# Patient Record
Sex: Female | Born: 1955
Health system: Southern US, Community
[De-identification: ages and names within clinical notes are randomized; demographics above are authoritative.]

## PROBLEM LIST (undated history)

## (undated) DIAGNOSIS — K649 Unspecified hemorrhoids: Secondary | ICD-10-CM

## (undated) DIAGNOSIS — E119 Type 2 diabetes mellitus without complications: Secondary | ICD-10-CM

## (undated) DIAGNOSIS — I10 Essential (primary) hypertension: Secondary | ICD-10-CM

## (undated) HISTORY — PX: APPENDECTOMY: SHX54

---

## 1997-09-02 ENCOUNTER — Ambulatory Visit (HOSPITAL_COMMUNITY): Admission: RE | Admit: 1997-09-02 | Discharge: 1997-09-03 | Payer: Self-pay | Admitting: *Deleted

## 1997-11-26 ENCOUNTER — Emergency Department (HOSPITAL_COMMUNITY): Admission: EM | Admit: 1997-11-26 | Discharge: 1997-11-26 | Payer: Self-pay | Admitting: Endocrinology

## 1998-05-05 ENCOUNTER — Other Ambulatory Visit: Admission: RE | Admit: 1998-05-05 | Discharge: 1998-05-05 | Payer: Self-pay | Admitting: Internal Medicine

## 1999-05-01 ENCOUNTER — Emergency Department (HOSPITAL_COMMUNITY): Admission: EM | Admit: 1999-05-01 | Discharge: 1999-05-01 | Payer: Self-pay | Admitting: *Deleted

## 1999-05-25 ENCOUNTER — Emergency Department (HOSPITAL_COMMUNITY): Admission: EM | Admit: 1999-05-25 | Discharge: 1999-05-25 | Payer: Self-pay | Admitting: Emergency Medicine

## 1999-05-29 ENCOUNTER — Encounter: Payer: Self-pay | Admitting: General Surgery

## 1999-05-29 ENCOUNTER — Ambulatory Visit (HOSPITAL_COMMUNITY): Admission: RE | Admit: 1999-05-29 | Discharge: 1999-05-29 | Payer: Self-pay | Admitting: General Surgery

## 1999-09-13 ENCOUNTER — Emergency Department (HOSPITAL_COMMUNITY): Admission: EM | Admit: 1999-09-13 | Discharge: 1999-09-13 | Payer: Self-pay | Admitting: Emergency Medicine

## 2000-04-06 ENCOUNTER — Emergency Department (HOSPITAL_COMMUNITY): Admission: EM | Admit: 2000-04-06 | Discharge: 2000-04-06 | Payer: Self-pay | Admitting: Internal Medicine

## 2000-05-28 ENCOUNTER — Encounter: Payer: Self-pay | Admitting: Emergency Medicine

## 2000-05-28 ENCOUNTER — Emergency Department (HOSPITAL_COMMUNITY): Admission: EM | Admit: 2000-05-28 | Discharge: 2000-05-28 | Payer: Self-pay | Admitting: Emergency Medicine

## 2001-10-11 ENCOUNTER — Emergency Department (HOSPITAL_COMMUNITY): Admission: EM | Admit: 2001-10-11 | Discharge: 2001-10-11 | Payer: Self-pay | Admitting: Emergency Medicine

## 2001-11-05 ENCOUNTER — Other Ambulatory Visit: Admission: RE | Admit: 2001-11-05 | Discharge: 2001-11-05 | Payer: Self-pay | Admitting: Family Medicine

## 2002-10-27 ENCOUNTER — Other Ambulatory Visit: Admission: RE | Admit: 2002-10-27 | Discharge: 2002-10-27 | Payer: Self-pay | Admitting: Family Medicine

## 2003-01-19 ENCOUNTER — Encounter: Admission: RE | Admit: 2003-01-19 | Discharge: 2003-01-19 | Payer: Self-pay | Admitting: Family Medicine

## 2003-01-19 ENCOUNTER — Encounter: Payer: Self-pay | Admitting: Family Medicine

## 2004-01-27 ENCOUNTER — Encounter: Admission: RE | Admit: 2004-01-27 | Discharge: 2004-01-27 | Payer: Self-pay | Admitting: Family Medicine

## 2004-02-15 ENCOUNTER — Emergency Department (HOSPITAL_COMMUNITY): Admission: EM | Admit: 2004-02-15 | Discharge: 2004-02-16 | Payer: Self-pay | Admitting: Emergency Medicine

## 2004-10-03 ENCOUNTER — Encounter: Admission: RE | Admit: 2004-10-03 | Discharge: 2004-10-03 | Payer: Self-pay | Admitting: Family Medicine

## 2004-11-14 ENCOUNTER — Other Ambulatory Visit: Admission: RE | Admit: 2004-11-14 | Discharge: 2004-11-14 | Payer: Self-pay | Admitting: Family Medicine

## 2004-12-04 ENCOUNTER — Encounter: Admission: RE | Admit: 2004-12-04 | Discharge: 2004-12-04 | Payer: Self-pay | Admitting: Family Medicine

## 2005-01-26 ENCOUNTER — Ambulatory Visit (HOSPITAL_COMMUNITY): Admission: RE | Admit: 2005-01-26 | Discharge: 2005-01-26 | Payer: Self-pay | Admitting: Family Medicine

## 2005-08-27 ENCOUNTER — Emergency Department (HOSPITAL_COMMUNITY): Admission: EM | Admit: 2005-08-27 | Discharge: 2005-08-28 | Payer: Self-pay | Admitting: Emergency Medicine

## 2005-09-20 ENCOUNTER — Encounter: Admission: RE | Admit: 2005-09-20 | Discharge: 2005-09-20 | Payer: Self-pay | Admitting: Family Medicine

## 2005-10-29 ENCOUNTER — Encounter: Admission: RE | Admit: 2005-10-29 | Discharge: 2005-10-29 | Payer: Self-pay | Admitting: Internal Medicine

## 2005-12-25 ENCOUNTER — Other Ambulatory Visit: Admission: RE | Admit: 2005-12-25 | Discharge: 2005-12-25 | Payer: Self-pay | Admitting: Family Medicine

## 2006-02-04 ENCOUNTER — Encounter: Admission: RE | Admit: 2006-02-04 | Discharge: 2006-02-04 | Payer: Self-pay | Admitting: Family Medicine

## 2006-09-07 ENCOUNTER — Observation Stay (HOSPITAL_COMMUNITY): Admission: EM | Admit: 2006-09-07 | Discharge: 2006-09-08 | Payer: Self-pay | Admitting: Emergency Medicine

## 2006-09-07 ENCOUNTER — Encounter (INDEPENDENT_AMBULATORY_CARE_PROVIDER_SITE_OTHER): Payer: Self-pay | Admitting: Specialist

## 2006-09-22 ENCOUNTER — Emergency Department (HOSPITAL_COMMUNITY): Admission: EM | Admit: 2006-09-22 | Discharge: 2006-09-23 | Payer: Self-pay | Admitting: Emergency Medicine

## 2007-01-23 ENCOUNTER — Other Ambulatory Visit: Admission: RE | Admit: 2007-01-23 | Discharge: 2007-01-23 | Payer: Self-pay | Admitting: Family Medicine

## 2007-02-19 ENCOUNTER — Encounter: Admission: RE | Admit: 2007-02-19 | Discharge: 2007-02-19 | Payer: Self-pay | Admitting: Family Medicine

## 2007-05-27 ENCOUNTER — Encounter: Admission: RE | Admit: 2007-05-27 | Discharge: 2007-05-27 | Payer: Self-pay | Admitting: Family Medicine

## 2007-08-22 ENCOUNTER — Emergency Department (HOSPITAL_COMMUNITY): Admission: EM | Admit: 2007-08-22 | Discharge: 2007-08-23 | Payer: Self-pay | Admitting: Emergency Medicine

## 2008-02-12 ENCOUNTER — Other Ambulatory Visit: Admission: RE | Admit: 2008-02-12 | Discharge: 2008-02-12 | Payer: Self-pay | Admitting: Family Medicine

## 2008-02-23 ENCOUNTER — Encounter: Admission: RE | Admit: 2008-02-23 | Discharge: 2008-02-23 | Payer: Self-pay | Admitting: Family Medicine

## 2008-06-03 ENCOUNTER — Emergency Department (HOSPITAL_COMMUNITY): Admission: EM | Admit: 2008-06-03 | Discharge: 2008-06-03 | Payer: Self-pay | Admitting: Emergency Medicine

## 2009-02-15 ENCOUNTER — Other Ambulatory Visit: Admission: RE | Admit: 2009-02-15 | Discharge: 2009-02-15 | Payer: Self-pay | Admitting: Family Medicine

## 2009-02-23 ENCOUNTER — Encounter: Admission: RE | Admit: 2009-02-23 | Discharge: 2009-02-23 | Payer: Self-pay | Admitting: Family Medicine

## 2010-05-04 ENCOUNTER — Emergency Department (HOSPITAL_COMMUNITY)
Admission: EM | Admit: 2010-05-04 | Discharge: 2010-05-05 | Payer: Self-pay | Source: Home / Self Care | Admitting: Emergency Medicine

## 2010-05-04 LAB — COMPREHENSIVE METABOLIC PANEL
ALT: 28 U/L (ref 0–35)
AST: 23 U/L (ref 0–37)
Albumin: 3.5 g/dL (ref 3.5–5.2)
Alkaline Phosphatase: 88 U/L (ref 39–117)
BUN: 20 mg/dL (ref 6–23)
CO2: 25 mEq/L (ref 19–32)
Calcium: 8.6 mg/dL (ref 8.4–10.5)
Chloride: 99 mEq/L (ref 96–112)
Creatinine, Ser: 1.16 mg/dL (ref 0.4–1.2)
GFR calc Af Amer: 59 mL/min — ABNORMAL LOW (ref >60)
GFR calc non Af Amer: 49 mL/min — ABNORMAL LOW (ref >60)
Glucose, Bld: 127 mg/dL — ABNORMAL HIGH (ref 70–99)
Potassium: 3.5 mEq/L (ref 3.5–5.1)
Sodium: 134 mEq/L — ABNORMAL LOW (ref 135–145)
Total Bilirubin: 0.7 mg/dL (ref 0.3–1.2)
Total Protein: 7.5 g/dL (ref 6.0–8.3)

## 2010-05-04 LAB — CBC
HCT: 39.3 % (ref 36.0–46.0)
Hemoglobin: 13.1 g/dL (ref 12.0–15.0)
MCH: 28.7 pg (ref 26.0–34.0)
MCHC: 33.3 g/dL (ref 30.0–36.0)
MCV: 86 fL (ref 78.0–100.0)
Platelets: 178 10*3/uL (ref 150–400)
RBC: 4.57 MIL/uL (ref 3.87–5.11)
RDW: 12.7 % (ref 11.5–15.5)
WBC: 6.3 10*3/uL (ref 4.0–10.5)

## 2010-05-04 LAB — DIFFERENTIAL
Basophils Absolute: 0 10*3/uL (ref 0.0–0.1)
Basophils Relative: 0 % (ref 0–1)
Eosinophils Absolute: 0.1 10*3/uL (ref 0.0–0.7)
Eosinophils Relative: 1 % (ref 0–5)
Lymphocytes Relative: 20 % (ref 12–46)
Lymphs Abs: 1.3 10*3/uL (ref 0.7–4.0)
Monocytes Absolute: 0.9 10*3/uL (ref 0.1–1.0)
Monocytes Relative: 14 % — ABNORMAL HIGH (ref 3–12)
Neutro Abs: 4.1 10*3/uL (ref 1.7–7.7)
Neutrophils Relative %: 65 % (ref 43–77)

## 2010-05-05 LAB — URINALYSIS, ROUTINE W REFLEX MICROSCOPIC
Bilirubin Urine: NEGATIVE
Hemoglobin, Urine: NEGATIVE
Ketones, ur: NEGATIVE mg/dL
Nitrite: NEGATIVE
Protein, ur: NEGATIVE mg/dL
Specific Gravity, Urine: 1.02 (ref 1.005–1.030)
Urine Glucose, Fasting: NEGATIVE mg/dL
Urobilinogen, UA: 0.2 mg/dL (ref 0.0–1.0)
pH: 5.5 (ref 5.0–8.0)

## 2010-05-20 ENCOUNTER — Encounter: Payer: Self-pay | Admitting: Family Medicine

## 2010-05-24 ENCOUNTER — Encounter
Admission: RE | Admit: 2010-05-24 | Discharge: 2010-05-30 | Payer: Self-pay | Source: Home / Self Care | Attending: Family Medicine | Admitting: Family Medicine

## 2010-05-24 ENCOUNTER — Encounter
Admission: RE | Admit: 2010-05-24 | Discharge: 2010-05-24 | Payer: Self-pay | Source: Home / Self Care | Attending: Family Medicine | Admitting: Family Medicine

## 2010-09-12 NOTE — H&P (Signed)
NAMEMarland Kitchen  Colleen [name], Colleen [name] NO.:  [id number]   MEDICAL RECORD NO.:  [id number]          PATIENT TYPE:  EMS   LOCATION:  ED                           FACILITY:  [facility]   PHYSICIAN:  Velora Heckler, MD      DATE OF BIRTH:  10/06/1955   DATE OF ADMISSION:  09/06/2006  DATE OF DISCHARGE:                              HISTORY & PHYSICAL   Referring physician:  Lorre Nick, M.D., Emergency Department   Primary care physician:  Dr. Angelita Ingles. [place]   CHIEF COMPLAINT:  Abdominal pain.   HISTORY OF PRESENT ILLNESS:  The patient is a 55 year old black female  who presents to the emergency department with a 24-hour history of  abdominal pain.  This has gradually localized to the right lower  quadrant.  The patient denies fevers, chills, nausea, vomiting or  diarrhea.  The patient was seen by emergency room physician.  Laboratory  studies were essentially normal.  CT scan abdomen and pelvis was  obtained, which showed findings of acute appendicitis with an  appendicolith and inflammatory changes.  General surgery is now called  for appendectomy.   PAST MEDICAL HISTORY:  1. Status post bilateral tubal ligation.  2. History of hypertension.  3. History of gastroesophageal reflux.  4. History of anxiety.   MEDICATIONS:  Diovan, Singulair, fluoxetine, Prilosec.   ALLERGIES:  None known.   SOCIAL HISTORY:  The patient has a 60 year old daughter.  She smokes  cigarettes.  She denies alcohol use.  She is unemployed.   FAMILY HISTORY:  Unremarkable.   A 15-system review documented in the medical record without significant  other findings except as noted above.   PHYSICAL EXAMINATION:  GENERAL:  A 55 year old mildly obese black female  on a stretcher in the emergency department in no acute distress.  VITAL SIGNS:  Temperature 97.6, pulse 67, respirations 21, blood  pressure 172/94.  HEENT:  Normocephalic, atraumatic.  Sclerae clear.  Conjunctivae clear.  Pupils equal and  reactive.  Dentition good.  Mucous membranes moist.  Voice normal.  NECK:  Palpation of the neck shows no thyroid nodularity, no  lymphadenopathy.  CHEST:  Auscultation of the chest shows few scattered rhonchi, greater  on the right than on the left, otherwise good breath sounds bilaterally.  CARDIAC:  Regular rate and rhythm without murmur.  Peripheral pulses are  full.  ABDOMEN:  Soft, obese.  Bowel sounds are present.  There is tenderness  to palpation and percussion, particularly in the right lower quadrant.  There is mild voluntary guarding.  There is mild rebound tenderness.  There is no palpable mass.  EXTREMITIES:  Nontender without edema.  NEUROLOGIC:  The patient is alert and oriented without sign of tremor.Marland Kitchen   LABORATORY STUDIES:  White count 7.4, hemoglobin 11.4, platelet count  164,000.  Differential shows 62% segmented neutrophils.  Electrolytes  were normal.   RADIOGRAPHIC STUDIES:  CT scan abdomen and pelvis is reviewed.  Radiologist reads, Distal appendicitis with appendicolith and  surrounding inflammatory changes.  It does not appear to be perforated.   IMPRESSION:  Acute appendicitis.   PLAN:  The  patient will be admitted on the surgical service at [facility].  The patient will be prepared and taken  directly to the operating room for a laparoscopic appendectomy.  She  will receive antibiotics postoperatively.  The patient and I discussed  the procedure.  We discussed approximately a 90% chance of success with  laparoscopic technique and approximately 10% chance of conversion to  open surgery.  She understands and wishes to proceed.      Velora Heckler, MD  Electronically Signed     TMG/MEDQ  D:  09/07/2006  T:  09/07/2006  Job:  [id number]

## 2010-09-12 NOTE — Op Note (Signed)
Colleen, [name] NO.:  [id number]   MEDICAL RECORD NO.:  [id number]          PATIENT TYPE:  INP   LOCATION:  0098                         FACILITY:  [facility]   PHYSICIAN:  Velora Heckler, MD      DATE OF BIRTH:  Sep 04, 1955   DATE OF PROCEDURE:  09/06/2006  DATE OF DISCHARGE:                               OPERATIVE REPORT   PREOPERATIVE DIAGNOSIS:  Acute appendicitis.   POSTOPERATIVE DIAGNOSIS:  Acute appendicitis.   PROCEDURE:  Laparoscopic appendectomy.   SURGEON:  Velora Heckler, M.D., FACS   ANESTHESIA:  General per Dr. Eilene Ghazi.   ESTIMATED BLOOD LOSS:  Minimal.   PREPARATION:  Betadine.   COMPLICATIONS:  None.   INDICATIONS:  The patient is a 55 year old black female from [place],  [place].  She presents with a 24 hour history of abdominal pain.  The patient was evaluated by the emergency room physician.  CT scan of  the abdomen and pelvis was obtained which showed findings consistent  with acute appendicitis.  General surgery was contacted and the patient  was prepared for the operating room.   BODY OF REPORT:  The procedure was done in OR #1 at the [facility]  The patient was brought to the operating room and placed in  the supine position on the operating room table.  Following  administration of general anesthesia, the patient is prepped and draped  in the usual strict aseptic fashion.  After ascertaining that an  adequate level of anesthesia had been obtained, a supraumbilical  incision was made in the midline with a #15 blade.  Dissection was  carried down to the fascia.  The fascia is incised in the midline.  The  peritoneal cavity is entered cautiously.  0 Vicryl pursestring suture is  placed in the fascia.  A Hassan cannula was introduced and secured with  a pursestring suture.  The abdomen was insufflated carbon dioxide.  The  laparoscope was introduced and the abdomen was explored.  Operative  ports were  placed in the right upper quadrant and left lower quadrant.   The cecum was mobilized.  The base of the appendix was identified.  The  appendix was then gently mobilized.  The proximal half of the appendix  is normal.  However, the distal portion of the appendix was acutely  inflamed, distended, and somewhat adherent to the right pelvic sidewall.  It is gently dissected out with blunt dissection and fully mobilized.  Using the harmonic scalpel, the mesoappendix was divided.  Dissection  was carried down to the base of the appendix.  The base of the appendix  was then transected at its junction with the cecal wall with an Endo-GIA  stapler using a vascular cartridge.  The appendix was placed into an  EndoCatch bag and withdrawn through the umbilical port without  difficulty.  It is submitted to pathology for review.   The right lower quadrant is inspected.  Good hemostasis is noted.  The  staple line was inspected closely and good hemostasis was noted along  the staple line.  The ports  were removed under direct vision.  Pneumoperitoneum was released.  0 Vicryl pursestring suture was tied  securely.  The wounds were anesthetized with local anesthetic.  All  wounds were closed with interrupted 4-0 Vicryl subcuticular sutures.  The wounds were washed and dried and Benzoin and Steri-Strips were  applied.  Sterile dressings were applied.  The patient is awakened from  anesthesia and brought to the recovery room in stable condition.  The  patient tolerated the procedure well.      Velora Heckler, MD  Electronically Signed     TMG/MEDQ  D:  09/07/2006  T:  09/07/2006  Job:  [id number]

## 2010-11-01 ENCOUNTER — Emergency Department (HOSPITAL_COMMUNITY)
Admission: EM | Admit: 2010-11-01 | Discharge: 2010-11-02 | Disposition: A | Discharge status: Home / Self Care | Payer: Medicare Other | Attending: Emergency Medicine | Admitting: Emergency Medicine

## 2010-11-01 DIAGNOSIS — F411 Generalized anxiety disorder: Secondary | ICD-10-CM | POA: Insufficient documentation

## 2010-11-01 DIAGNOSIS — I10 Essential (primary) hypertension: Secondary | ICD-10-CM | POA: Insufficient documentation

## 2010-11-01 DIAGNOSIS — E119 Type 2 diabetes mellitus without complications: Secondary | ICD-10-CM | POA: Insufficient documentation

## 2010-11-01 DIAGNOSIS — R079 Chest pain, unspecified: Secondary | ICD-10-CM | POA: Insufficient documentation

## 2010-11-02 LAB — CBC
HCT: 34 % — ABNORMAL LOW (ref 36.0–46.0)
Hemoglobin: 11.2 g/dL — ABNORMAL LOW (ref 12.0–15.0)
MCH: 27.8 pg (ref 26.0–34.0)
MCHC: 32.9 g/dL (ref 30.0–36.0)
MCV: 84.4 fL (ref 78.0–100.0)
RBC: 4.03 MIL/uL (ref 3.87–5.11)

## 2010-11-02 LAB — URINALYSIS, ROUTINE W REFLEX MICROSCOPIC
Glucose, UA: NEGATIVE mg/dL
Hgb urine dipstick: NEGATIVE
Leukocytes, UA: NEGATIVE
Protein, ur: NEGATIVE mg/dL
Specific Gravity, Urine: 1.015 (ref 1.005–1.030)
pH: 6 (ref 5.0–8.0)

## 2010-11-02 LAB — DIFFERENTIAL
Basophils Relative: 0 % (ref 0–1)
Lymphs Abs: 2.4 10*3/uL (ref 0.7–4.0)
Monocytes Absolute: 0.5 10*3/uL (ref 0.1–1.0)
Monocytes Relative: 7 % (ref 3–12)
Neutro Abs: 4.3 10*3/uL (ref 1.7–7.7)
Neutrophils Relative %: 59 % (ref 43–77)

## 2010-11-02 LAB — POCT I-STAT, CHEM 8
BUN: 14 mg/dL (ref 6–23)
Calcium, Ion: 1.14 mmol/L (ref 1.12–1.32)
Creatinine, Ser: 0.9 mg/dL (ref 0.50–1.10)
Glucose, Bld: 97 mg/dL (ref 70–99)
Hemoglobin: 12.2 g/dL (ref 12.0–15.0)
TCO2: 24 mmol/L (ref 0–100)

## 2010-11-02 LAB — CK TOTAL AND CKMB (NOT AT ARMC): CK, MB: 1.3 ng/mL (ref 0.3–4.0)

## 2011-01-09 ENCOUNTER — Other Ambulatory Visit: Payer: Self-pay | Admitting: Family Medicine

## 2011-01-09 ENCOUNTER — Other Ambulatory Visit (HOSPITAL_COMMUNITY)
Admission: RE | Admit: 2011-01-09 | Discharge: 2011-01-09 | Disposition: A | Discharge status: Home / Self Care | Payer: Medicare Other | Source: Ambulatory Visit | Attending: Family Medicine | Admitting: Family Medicine

## 2011-01-09 DIAGNOSIS — Z1159 Encounter for screening for other viral diseases: Secondary | ICD-10-CM | POA: Insufficient documentation

## 2011-01-09 DIAGNOSIS — Z124 Encounter for screening for malignant neoplasm of cervix: Secondary | ICD-10-CM | POA: Insufficient documentation

## 2011-09-23 IMAGING — MG MM DIGITAL SCREENING
4 series · 4 of 4 positions shown · non-contrast
Comparison: none

DG SCREEN MAMMOGRAM BILATERAL
Bilateral CC and MLO view(s) were taken.

DIGITAL SCREENING MAMMOGRAM WITH CAD:
There are scattered fibroglandular densities.  No masses or malignant type calcifications are 
identified.  Compared with prior studies.
Images were processed with CAD.

[R CC]
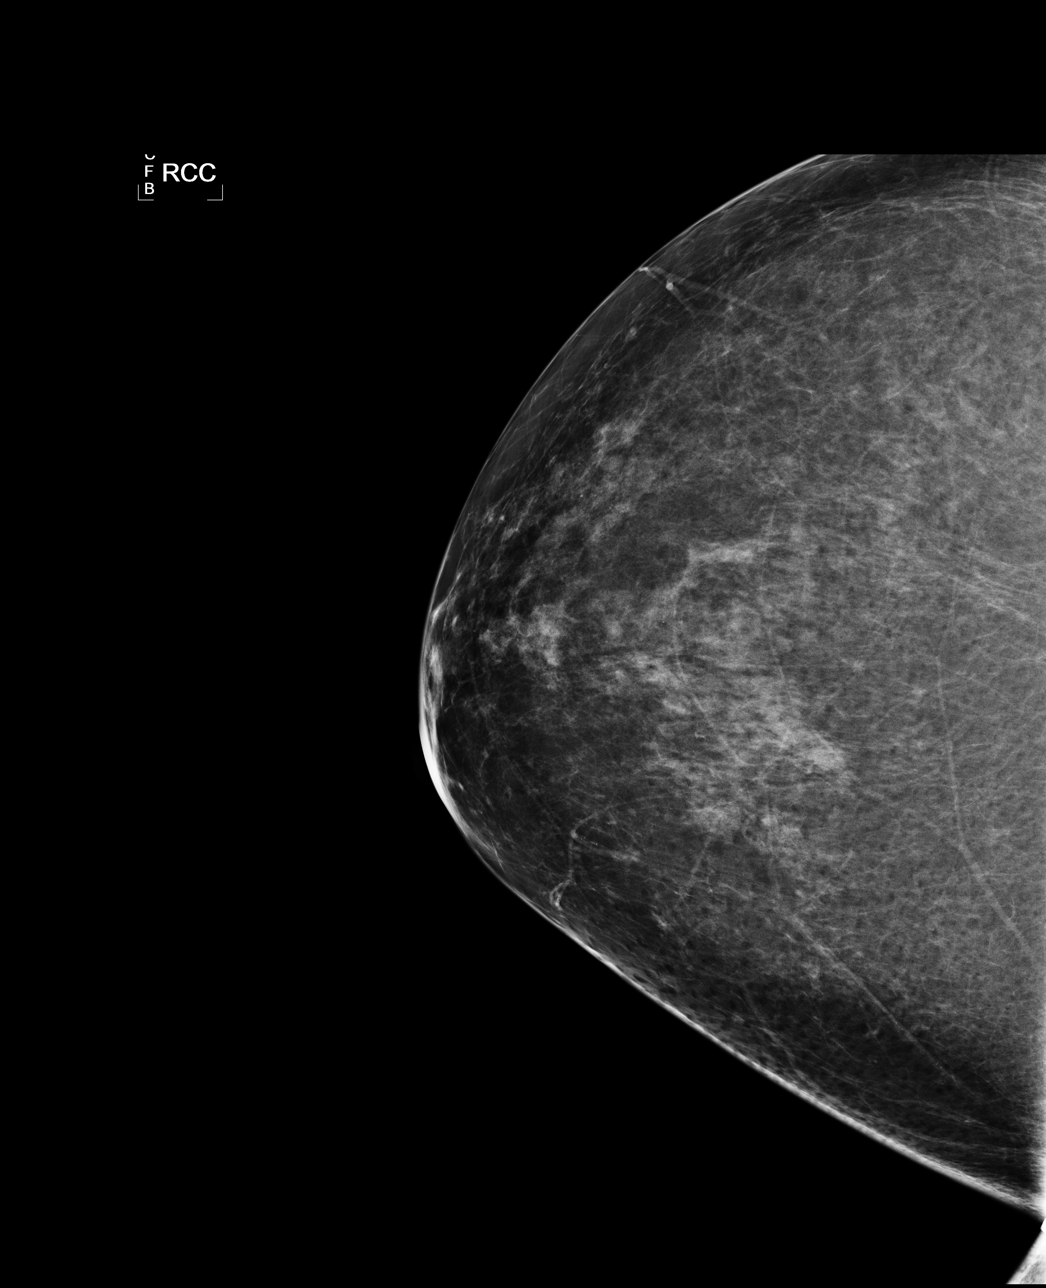

[L CC]
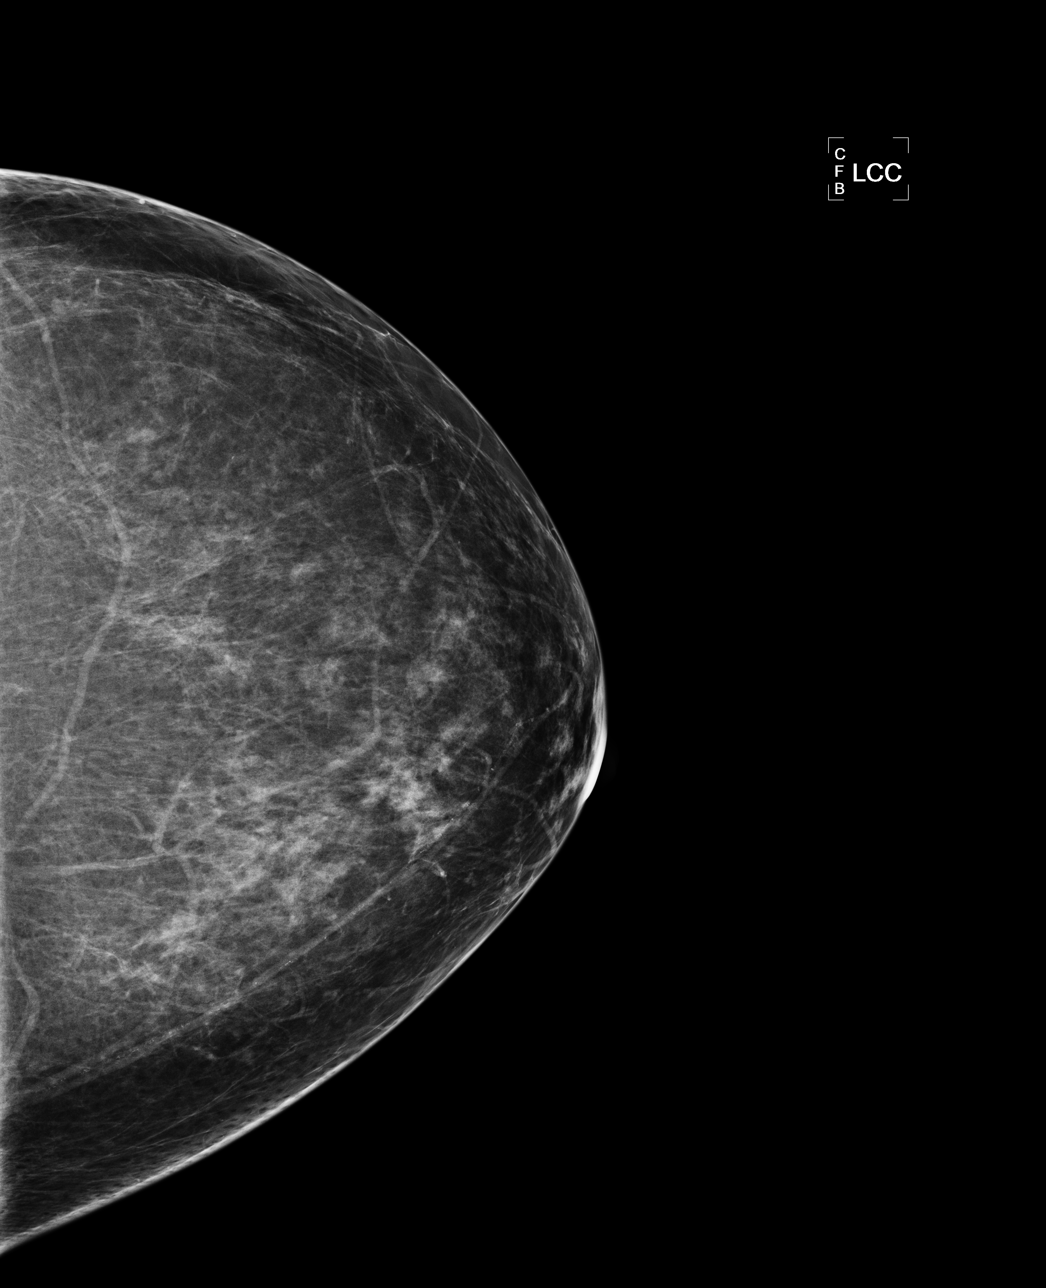

[L MLO]
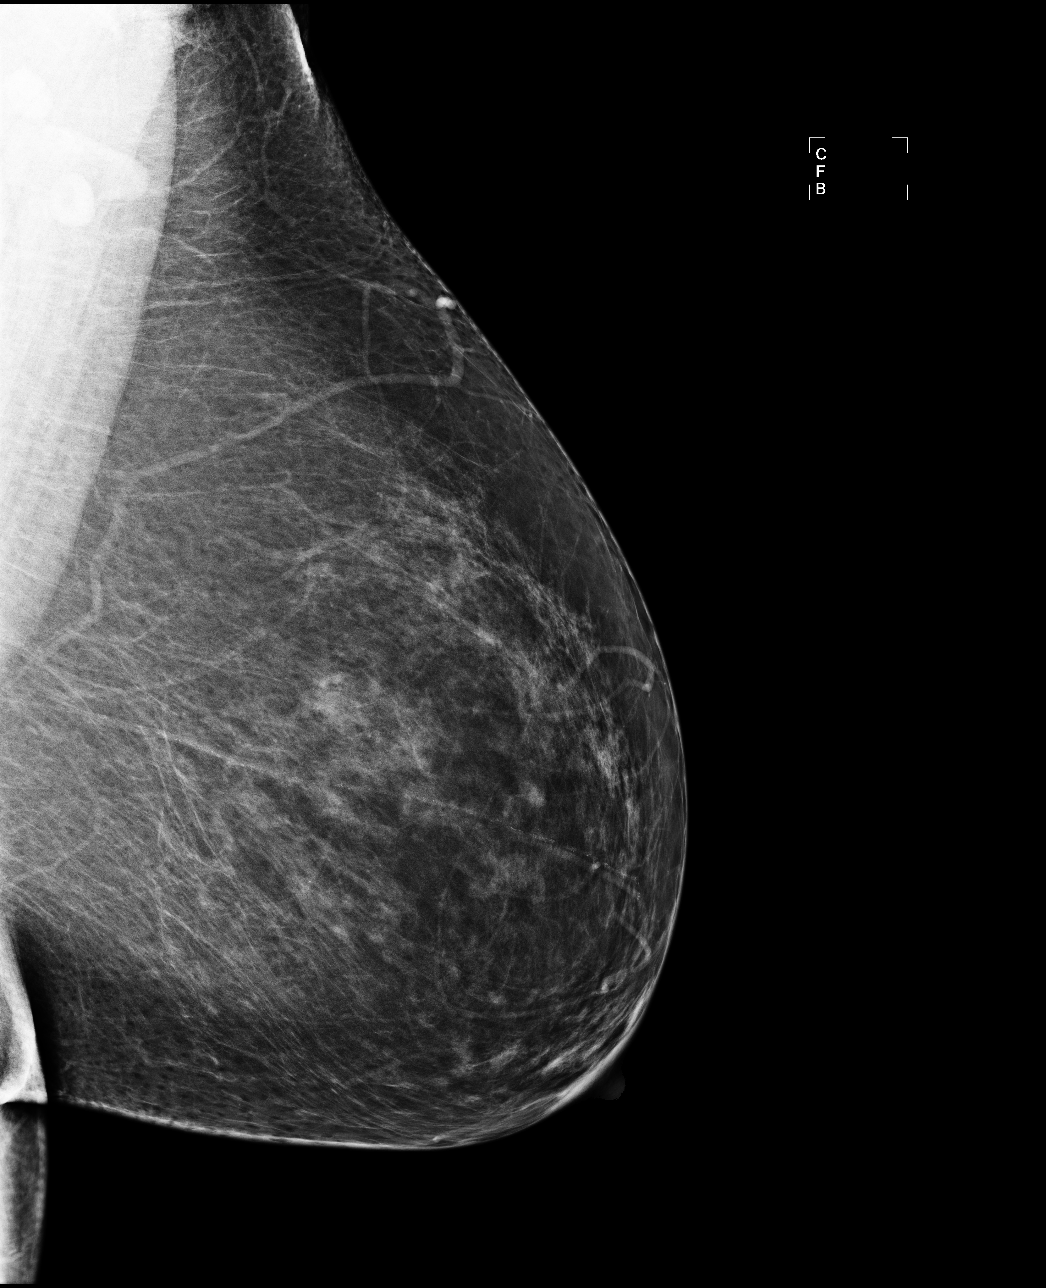

[R MLO]
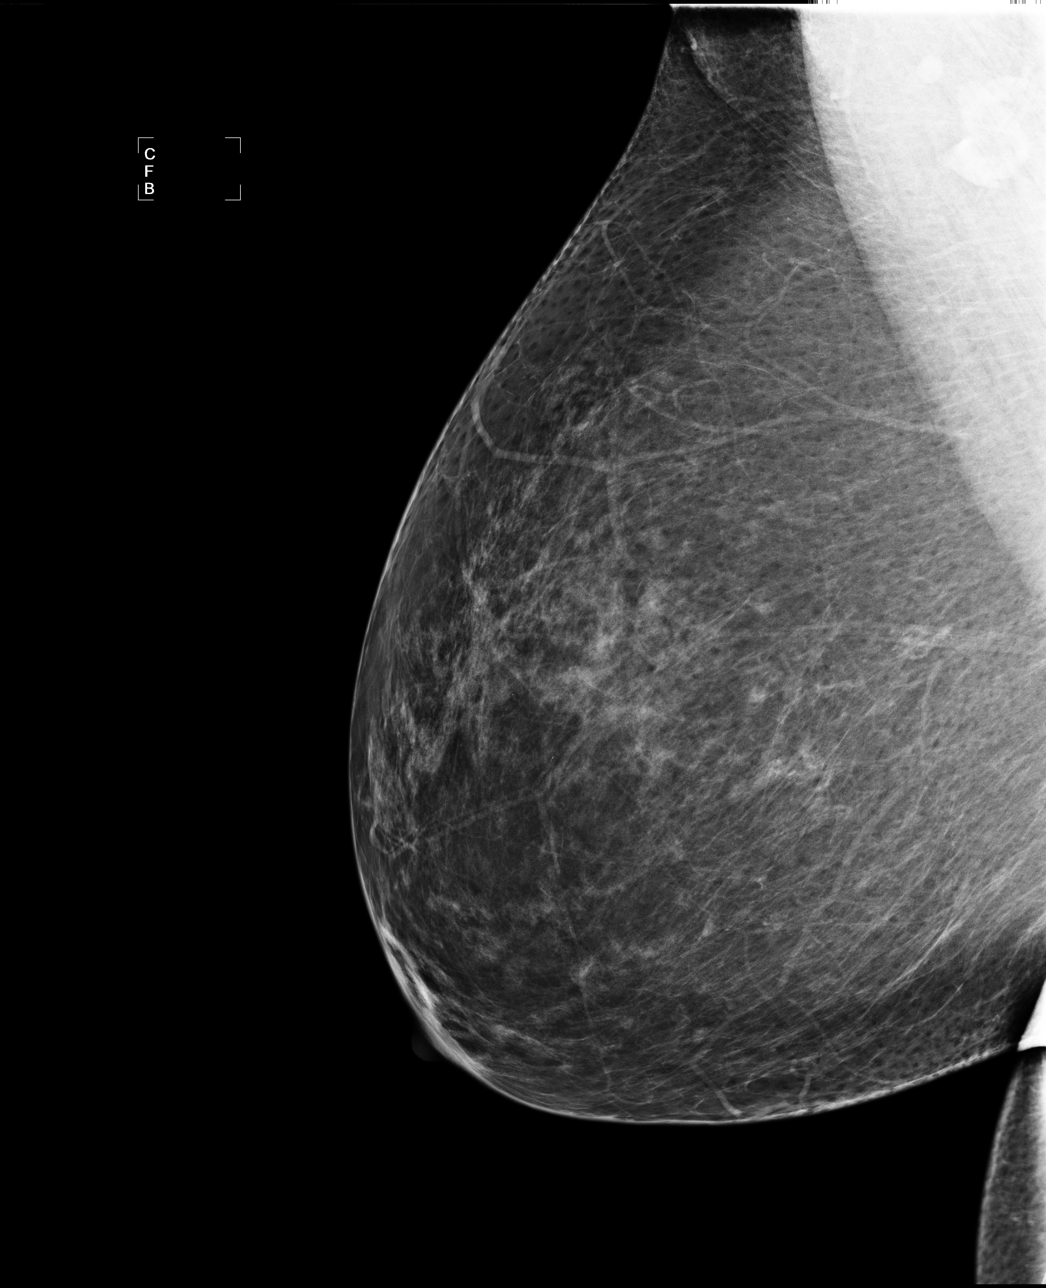

[4 of 4 positions shown; findings below may reference images not displayed]

IMPRESSION: No specific mammographic evidence of malignancy.  Next screening mammogram is recommended in one 
year.

A result letter of this screening mammogram will be mailed directly to the patient.

ASSESSMENT: Negative - BI-RADS 1

Screening mammogram in 1 year.
,

## 2012-01-17 ENCOUNTER — Other Ambulatory Visit (HOSPITAL_COMMUNITY)
Admission: RE | Admit: 2012-01-17 | Discharge: 2012-01-17 | Disposition: A | Discharge status: Home / Self Care | Payer: Medicare Other | Source: Ambulatory Visit | Attending: Family Medicine | Admitting: Family Medicine

## 2012-01-17 ENCOUNTER — Other Ambulatory Visit: Payer: Self-pay | Admitting: Family Medicine

## 2012-01-17 DIAGNOSIS — Z124 Encounter for screening for malignant neoplasm of cervix: Secondary | ICD-10-CM | POA: Insufficient documentation

## 2012-12-19 ENCOUNTER — Emergency Department (HOSPITAL_COMMUNITY): Payer: PRIVATE HEALTH INSURANCE

## 2012-12-19 ENCOUNTER — Emergency Department (HOSPITAL_COMMUNITY)
Admission: EM | Admit: 2012-12-19 | Discharge: 2012-12-19 | Disposition: A | Discharge status: Home / Self Care | Payer: PRIVATE HEALTH INSURANCE | Attending: Emergency Medicine | Admitting: Emergency Medicine

## 2012-12-19 ENCOUNTER — Encounter (HOSPITAL_COMMUNITY): Payer: Self-pay | Admitting: Emergency Medicine

## 2012-12-19 DIAGNOSIS — IMO0002 Reserved for concepts with insufficient information to code with codable children: Secondary | ICD-10-CM | POA: Insufficient documentation

## 2012-12-19 DIAGNOSIS — K648 Other hemorrhoids: Secondary | ICD-10-CM

## 2012-12-19 DIAGNOSIS — I10 Essential (primary) hypertension: Secondary | ICD-10-CM | POA: Insufficient documentation

## 2012-12-19 DIAGNOSIS — E119 Type 2 diabetes mellitus without complications: Secondary | ICD-10-CM | POA: Insufficient documentation

## 2012-12-19 DIAGNOSIS — K645 Perianal venous thrombosis: Secondary | ICD-10-CM | POA: Insufficient documentation

## 2012-12-19 DIAGNOSIS — Z9089 Acquired absence of other organs: Secondary | ICD-10-CM | POA: Insufficient documentation

## 2012-12-19 DIAGNOSIS — F17200 Nicotine dependence, unspecified, uncomplicated: Secondary | ICD-10-CM | POA: Insufficient documentation

## 2012-12-19 DIAGNOSIS — Z79899 Other long term (current) drug therapy: Secondary | ICD-10-CM | POA: Insufficient documentation

## 2012-12-19 HISTORY — DX: Essential (primary) hypertension: I10

## 2012-12-19 HISTORY — DX: Type 2 diabetes mellitus without complications: E11.9

## 2012-12-19 LAB — CBC WITH DIFFERENTIAL/PLATELET
Basophils Relative: 0 % (ref 0–1)
Eosinophils Absolute: 0 10*3/uL (ref 0.0–0.7)
Eosinophils Relative: 0 % (ref 0–5)
HCT: 38.5 % (ref 36.0–46.0)
Monocytes Relative: 5 % (ref 3–12)
Platelets: 170 10*3/uL (ref 150–400)
RBC: 4.55 MIL/uL (ref 3.87–5.11)
RDW: 13.4 % (ref 11.5–15.5)
WBC: 11 10*3/uL — ABNORMAL HIGH (ref 4.0–10.5)

## 2012-12-19 LAB — BASIC METABOLIC PANEL
BUN: 10 mg/dL (ref 6–23)
Calcium: 9.5 mg/dL (ref 8.4–10.5)
Creatinine, Ser: 1.02 mg/dL (ref 0.50–1.10)
GFR calc Af Amer: 70 mL/min — ABNORMAL LOW (ref >90)
GFR calc non Af Amer: 60 mL/min — ABNORMAL LOW (ref >90)
Potassium: 4 mEq/L (ref 3.5–5.1)

## 2012-12-19 MED ORDER — HYDROCODONE-ACETAMINOPHEN 5-325 MG PO TABS: 1.0000 | ORAL_TABLET | ORAL | Status: DC | PRN

## 2012-12-19 MED ORDER — SODIUM CHLORIDE 0.9 % IV BOLUS (SEPSIS)
1000.0000 mL | Freq: Once | INTRAVENOUS | Status: AC
  Administered 2012-12-19: 1000 mL via INTRAVENOUS

## 2012-12-19 MED ORDER — ONDANSETRON HCL 4 MG/2ML IJ SOLN
4.0000 mg | Freq: Once | INTRAMUSCULAR | Status: AC
  Administered 2012-12-19: 4 mg via INTRAVENOUS
  Filled 2012-12-19: qty 2

## 2012-12-19 MED ORDER — MORPHINE SULFATE 4 MG/ML IJ SOLN
4.0000 mg | Freq: Once | INTRAMUSCULAR | Status: DC | PRN
  Filled 2012-12-19: qty 1

## 2012-12-19 MED ORDER — IOHEXOL 300 MG/ML  SOLN
100.0000 mL | Freq: Once | INTRAMUSCULAR | Status: AC | PRN
  Administered 2012-12-19: 100 mL via INTRAVENOUS

## 2012-12-19 MED ORDER — IOHEXOL 300 MG/ML  SOLN
50.0000 mL | Freq: Once | INTRAMUSCULAR | Status: AC | PRN
  Administered 2012-12-19: 50 mL via ORAL

## 2012-12-19 MED ORDER — MORPHINE SULFATE 4 MG/ML IJ SOLN
4.0000 mg | Freq: Once | INTRAMUSCULAR | Status: AC
  Administered 2012-12-19: 4 mg via INTRAVENOUS
  Filled 2012-12-19: qty 1

## 2012-12-19 MED ORDER — MORPHINE SULFATE 4 MG/ML IJ SOLN
4.0000 mg | Freq: Once | INTRAMUSCULAR | Status: AC
  Administered 2012-12-19: 4 mg via INTRAVENOUS

## 2012-12-19 MED ORDER — HYDROCORTISONE ACETATE 25 MG RE SUPP
25.0000 mg | Freq: Once | RECTAL | Status: AC
  Administered 2012-12-19: 25 mg via RECTAL
  Filled 2012-12-19: qty 1

## 2012-12-19 MED ORDER — HYDROCORTISONE 2.5 % RE CREA: TOPICAL_CREAM | RECTAL | Status: DC

## 2012-12-19 NOTE — ED Notes (Addendum)
Per EMS pt has had hemorrhoid pain since yesterday. Hx of hemorrhoids for 19 yrs. Also hx of HTN and DM. Per EMS no bleeding noted.

## 2012-12-19 NOTE — ED Provider Notes (Signed)
CSN: [id number]     Arrival date & time 12/19/12  1913 History     First MD Initiated Contact with Patient 12/19/12 1915     Chief Complaint  Patient presents with  . Hemorrhoids   (Consider location/radiation/quality/duration/timing/severity/associated sxs/prior Treatment) HPI  Colleen [name] is a 57 y.o.female with a significant PMH of hemorrhoids presents to the ER with complaints of rectal pain that started yesterday while she was watching TV. She is tried taking warm baths but they have not helped her symptoms. She's not tried to push it back in. She's not had any rectal bleeding. She endorses feeling fevers and chills. No nausea, vomiting, diarrhea or weakness.   Past Medical History  Diagnosis Date  . Hypertension   . Diabetes mellitus without complication    Past Surgical History  Procedure Laterality Date  . Appendectomy     No family history on file. History  Substance Use Topics  . Smoking status: Current Every Day Smoker -- 0.50 packs/day    Types: Cigarettes  . Smokeless tobacco: Not on file  . Alcohol Use: No   OB History   Grav Para Term Preterm Abortions TAB SAB Ect Mult Living                 Review of Systems ROS is negative unless otherwise stated in the HPI  Allergies  Review of patient's allergies indicates no known allergies.  Home Medications   Current Outpatient Rx  Name  Route  Sig  Dispense  Refill  . ibuprofen (ADVIL,MOTRIN) 200 MG tablet   Oral   Take 200 mg by mouth every 6 (six) hours as needed for pain.         . metFORMIN (GLUCOPHAGE) 500 MG tablet   Oral   Take 500 mg by mouth at bedtime.         Marland Kitchen telmisartan-hydrochlorothiazide (MICARDIS HCT) 40-12.5 MG per tablet   Oral   Take 1 tablet by mouth daily.         Marland Kitchen HYDROcodone-acetaminophen (NORCO/VICODIN) 5-325 MG per tablet   Oral   Take 1-2 tablets by mouth every 4 (four) hours as needed for pain.   20 tablet   0   . hydrocortisone (ANUSOL-HC) 2.5 % rectal  cream      Apply rectally 2 times daily   30 g   0    BP 152/80  Pulse 83  Temp(Src) 99.1 F (37.3 C) (Oral)  Resp 18  Ht 5\' 5"  (1.651 m)  Wt 182 lb (82.555 kg)  BMI 30.29 kg/m2  SpO2 97% Physical Exam  Nursing note and vitals reviewed. Constitutional: She appears well-developed and well-nourished. No distress.  HENT:  Head: Normocephalic and atraumatic.   HEENT: Anicteric.  No pallor.  No discharge from ears, eyes, nose, or mouth.   Eyes: Pupils are equal, round, and reactive to light.  Neck: Normal range of motion. Neck supple.  Cardiovascular: Normal rate and regular rhythm.   Pulmonary/Chest: Effort normal.  Abdominal: Soft.  Genitourinary: Rectal exam shows tenderness.  Unable to palpate external or internal hemorrhoids. The patient has severe pain to the internal rectum without obvious abscess or lesion.  Neurological: She is alert.  Skin: Skin is warm and dry.    ED Course   Procedures (including critical care time)  Labs Reviewed  CBC WITH DIFFERENTIAL - Abnormal; Notable for the following:    WBC 11.0 (*)    Neutrophils Relative % 81 (*)    Neutro  Abs 8.9 (*)    All other components within normal limits  BASIC METABOLIC PANEL - Abnormal; Notable for the following:    Sodium 133 (*)    Chloride 95 (*)    Glucose, Bld 119 (*)    GFR calc non Af Amer 60 (*)    GFR calc Af Amer 70 (*)    All other components within normal limits   Ct Abdomen Pelvis W Contrast  12/19/2012   *RADIOLOGY REPORT*  Clinical Data: The patient is had hemorrhoid pain since yesterday. History of hemorrhoids for 19 years.  CT ABDOMEN AND PELVIS WITH CONTRAST  Technique:  Multidetector CT imaging of the abdomen and pelvis was performed following the standard protocol during bolus administration of intravenous contrast.  Contrast: OMNIPAQUE IOHEXOL 300 MG/ML  SOLN, 50mL OMNIPAQUE IOHEXOL 300 MG/ML  SOLN  Comparison: 09/07/2006  Findings: Mild dependent changes in the lung bases.   Mild diffuse fatty infiltration of the liver.  No focal liver lesions demonstrated.  Calcified granuloma in the spleen.  No splenomegaly.  The gallbladder, pancreas, adrenal glands, kidneys, abdominal aorta, and retroperitoneal lymph nodes are unremarkable. The stomach and small bowel are not abnormally distended.  Contrast material flows to the colon without evidence of obstruction.  No colonic distension or wall thickening.  Umbilical hernia containing a portion of the transverse colon without evidence of obstruction. This is stable since previous studies.  No free air or free fluid in the abdomen.  Pelvis:  Multiple uterine fibroids.  No abnormal adnexal masses. Bladder wall is mildly thickened which may be due to decompression. Cystitis is not excluded.  No free or loculated pelvic fluid collections.  No significant pelvic lymphadenopathy.  The appendix is surgically absent.  The rectosigmoid colon is unremarkable without evidence of inflammation.  Rounded low attenuation focus at the posterior anal region measuring about 8 mm.  This could represent a cyst or thrombosed hemorrhoid.  Degenerative changes in the lumbar spine.  Normal alignment.  No destructive bone lesions appreciated.  IMPRESSION: Diffuse fatty infiltration of the liver.  Stable appearance of umbilical hernia containing transverse colon.  No bowel obstruction.  Uterine fibroids.  Suggestion of a small cyst or thrombosed hemorrhoids in the anal region.   Original Report Authenticated By: Burman Nieves, M.D.   1. Internal hemorrhoids     MDM  Was unable to palpate internal hemorrhoids the patient had significant pain which left me with concern for possible rectal abscess. Fortunately the scan was negative and shows that she has thrombosed internal hemorrhoids.   Will Rx Anusol suppository             And Vicodin.  Given referral to GI.  57 y.o.Colleen [name]'s evaluation in the Emergency Department is complete. It has been  determined that no acute conditions requiring further emergency intervention are present at this time. The patient/guardian have been advised of the diagnosis and plan. We have discussed signs and symptoms that warrant return to the ED, such as changes or worsening in symptoms.  Vital signs are stable at discharge. Filed Vitals:   12/19/12 1934  BP: 152/80  Pulse: 83  Temp: 99.1 F (37.3 C)  Resp: 18    Patient/guardian has voiced understanding and agreed to follow-up with the PCP or specialist.     Dorthula Matas, PA-C 12/19/12 2149

## 2012-12-20 NOTE — ED Provider Notes (Signed)
Medical screening examination/treatment/procedure(s) were performed by non-physician practitioner and as supervising physician I was immediately available for consultation/collaboration.   Junius Argyle, MD 12/20/12 [phone]

## 2013-07-16 ENCOUNTER — Other Ambulatory Visit: Payer: Self-pay | Admitting: Family Medicine

## 2013-07-16 DIAGNOSIS — Z1231 Encounter for screening mammogram for malignant neoplasm of breast: Secondary | ICD-10-CM

## 2013-07-30 ENCOUNTER — Ambulatory Visit
Admission: RE | Admit: 2013-07-30 | Discharge: 2013-07-30 | Disposition: A | Discharge status: Home / Self Care | Payer: Medicare Other | Source: Ambulatory Visit | Attending: Family Medicine | Admitting: Family Medicine

## 2013-07-30 DIAGNOSIS — Z1231 Encounter for screening mammogram for malignant neoplasm of breast: Secondary | ICD-10-CM

## 2013-09-03 ENCOUNTER — Other Ambulatory Visit: Payer: Self-pay | Admitting: Family Medicine

## 2013-09-03 ENCOUNTER — Other Ambulatory Visit (HOSPITAL_COMMUNITY)
Admission: RE | Admit: 2013-09-03 | Discharge: 2013-09-03 | Disposition: A | Discharge status: Home / Self Care | Payer: PRIVATE HEALTH INSURANCE | Source: Ambulatory Visit | Attending: Family Medicine | Admitting: Family Medicine

## 2013-09-03 DIAGNOSIS — Z124 Encounter for screening for malignant neoplasm of cervix: Secondary | ICD-10-CM | POA: Insufficient documentation

## 2013-11-21 ENCOUNTER — Emergency Department (HOSPITAL_COMMUNITY)
Admission: EM | Admit: 2013-11-21 | Discharge: 2013-11-21 | Disposition: A | Discharge status: Home / Self Care | Payer: PRIVATE HEALTH INSURANCE | Attending: Emergency Medicine | Admitting: Emergency Medicine

## 2013-11-21 ENCOUNTER — Encounter (HOSPITAL_COMMUNITY): Payer: Self-pay | Admitting: Emergency Medicine

## 2013-11-21 DIAGNOSIS — F17200 Nicotine dependence, unspecified, uncomplicated: Secondary | ICD-10-CM | POA: Diagnosis not present

## 2013-11-21 DIAGNOSIS — Z79899 Other long term (current) drug therapy: Secondary | ICD-10-CM | POA: Insufficient documentation

## 2013-11-21 DIAGNOSIS — K644 Residual hemorrhoidal skin tags: Secondary | ICD-10-CM | POA: Insufficient documentation

## 2013-11-21 DIAGNOSIS — K649 Unspecified hemorrhoids: Secondary | ICD-10-CM | POA: Insufficient documentation

## 2013-11-21 DIAGNOSIS — E119 Type 2 diabetes mellitus without complications: Secondary | ICD-10-CM | POA: Diagnosis not present

## 2013-11-21 DIAGNOSIS — I10 Essential (primary) hypertension: Secondary | ICD-10-CM | POA: Insufficient documentation

## 2013-11-21 DIAGNOSIS — IMO0002 Reserved for concepts with insufficient information to code with codable children: Secondary | ICD-10-CM | POA: Insufficient documentation

## 2013-11-21 HISTORY — DX: Unspecified hemorrhoids: K64.9

## 2013-11-21 MED ORDER — DOCUSATE SODIUM 100 MG PO CAPS: 100.0000 mg | ORAL_CAPSULE | Freq: Two times a day (BID) | ORAL | Status: AC

## 2013-11-21 MED ORDER — HYDROCODONE-ACETAMINOPHEN 5-325 MG PO TABS: 1.0000 | ORAL_TABLET | Freq: Four times a day (QID) | ORAL | Status: DC | PRN

## 2013-11-21 MED ORDER — HYDROCORTISONE 2.5 % RE CREA: 1.0000 "application " | TOPICAL_CREAM | Freq: Two times a day (BID) | RECTAL | Status: DC

## 2013-11-21 NOTE — ED Provider Notes (Signed)
CSN: [id number]     Arrival date & time 11/21/13  1645 History   First MD Initiated Contact with Patient 11/21/13 1724     Chief Complaint  Patient presents with  . Hemorrhoids     (Consider location/radiation/quality/duration/timing/severity/associated sxs/prior Treatment) Patient is a 58 y.o. female presenting with general illness. The history is provided by the patient.  Illness Location:  Anus Quality:  Pain Severity:  Moderate Onset quality:  Sudden Duration:  2 days Timing:  Constant Progression:  Unchanged Chronicity:  Recurrent Context:  Hx of hemorrhoids, constipated 2 days ago, began having rectal pain at that time Relieved by:  Nothing Worsened by:  Having a bowel movement Associated symptoms: no cough, no fever and no shortness of breath     Past Medical History  Diagnosis Date  . Hypertension   . Diabetes mellitus without complication   . Hemorrhoids    Past Surgical History  Procedure Laterality Date  . Appendectomy     No family history on file. History  Substance Use Topics  . Smoking status: Current Every Day Smoker -- 0.50 packs/day    Types: Cigarettes  . Smokeless tobacco: Not on file  . Alcohol Use: No   OB History   Grav Para Term Preterm Abortions TAB SAB Ect Mult Living                 Review of Systems  Constitutional: Negative for fever.  Respiratory: Negative for cough and shortness of breath.   All other systems reviewed and are negative.     Allergies  Review of patient's allergies indicates no known allergies.  Home Medications   Prior to Admission medications   Medication Sig Start Date End Date Taking? Authorizing Provider  hydrocortisone (ANUSOL-HC) 2.5 % rectal cream Place 1 application rectally 2 (two) times daily as needed for hemorrhoids or itching.   Yes Historical Provider, MD  metFORMIN (GLUCOPHAGE) 500 MG tablet Take 500 mg by mouth at bedtime.   Yes Historical Provider, MD  telmisartan-hydrochlorothiazide  (MICARDIS HCT) 40-12.5 MG per tablet Take 1 tablet by mouth daily.   Yes Historical Provider, MD  docusate sodium (COLACE) 100 MG capsule Take 1 capsule (100 mg total) by mouth every 12 (twelve) hours. 11/21/13   Osvaldo Shipper, MD  HYDROcodone-acetaminophen (NORCO/VICODIN) 5-325 MG per tablet Take 1 tablet by mouth every 6 (six) hours as needed for moderate pain. 11/21/13   Osvaldo Shipper, MD  hydrocortisone (PROCTOSOL HC) 2.5 % rectal cream Place 1 application rectally 2 (two) times daily. 11/21/13   Osvaldo Shipper, MD   BP 129/73  Pulse 95  Temp(Src) 98.1 F (36.7 C) (Oral)  Resp 18  SpO2 100% Physical Exam  Nursing note and vitals reviewed. Constitutional: She is oriented to person, place, and time. She appears well-developed and well-nourished. No distress.  HENT:  Head: Normocephalic and atraumatic.  Mouth/Throat: Oropharynx is clear and moist.  Eyes: EOM are normal. Pupils are equal, round, and reactive to light.  Neck: Normal range of motion. Neck supple.  Cardiovascular: Normal rate and regular rhythm.  Exam reveals no friction rub.   No murmur heard. Pulmonary/Chest: Effort normal and breath sounds normal. No respiratory distress. She has no wheezes. She has no rales.  Abdominal: Soft. She exhibits no distension. There is no tenderness. There is no rebound.  Genitourinary: Rectal exam shows external hemorrhoid (soft, not thrombosed, at 6 o'clock) and tenderness (on rectal). Rectal exam shows no fissure, no mass and anal tone  normal. Guaiac positive stool.  Musculoskeletal: Normal range of motion. She exhibits no edema.  Neurological: She is alert and oriented to person, place, and time.  Skin: She is not diaphoretic.    ED Course  Procedures (including critical care time) Labs Review Labs Reviewed - No data to display  Imaging Review No results found.   EKG Interpretation None      MDM   Final diagnoses:  External hemorrhoid    36F here with  rectal pain. Soft nonthrombosed hemorrhoid present. Advised on sitz baths, given pain meds, stool softeners, anusol suppositories. Refused marcaine injection. Discharged, given surgery f/u.    Osvaldo Shipper, MD 11/21/13 [phone]

## 2013-11-21 NOTE — ED Notes (Signed)
Pt c/o rectal pain d/t hemorrhoids since yesterday.

## 2013-11-21 NOTE — ED Notes (Signed)
MD at bedside. 

## 2013-11-21 NOTE — Discharge Instructions (Signed)

## 2013-12-05 ENCOUNTER — Encounter (HOSPITAL_COMMUNITY): Payer: Self-pay | Admitting: Emergency Medicine

## 2013-12-05 ENCOUNTER — Emergency Department (HOSPITAL_COMMUNITY)
Admission: EM | Admit: 2013-12-05 | Discharge: 2013-12-05 | Disposition: A | Discharge status: Home / Self Care | Payer: PRIVATE HEALTH INSURANCE | Attending: Emergency Medicine | Admitting: Emergency Medicine

## 2013-12-05 DIAGNOSIS — T63461A Toxic effect of venom of wasps, accidental (unintentional), initial encounter: Secondary | ICD-10-CM | POA: Diagnosis not present

## 2013-12-05 DIAGNOSIS — T6391XA Toxic effect of contact with unspecified venomous animal, accidental (unintentional), initial encounter: Secondary | ICD-10-CM | POA: Insufficient documentation

## 2013-12-05 DIAGNOSIS — E119 Type 2 diabetes mellitus without complications: Secondary | ICD-10-CM | POA: Insufficient documentation

## 2013-12-05 DIAGNOSIS — Z79899 Other long term (current) drug therapy: Secondary | ICD-10-CM | POA: Diagnosis not present

## 2013-12-05 DIAGNOSIS — F17200 Nicotine dependence, unspecified, uncomplicated: Secondary | ICD-10-CM | POA: Insufficient documentation

## 2013-12-05 DIAGNOSIS — T63441A Toxic effect of venom of bees, accidental (unintentional), initial encounter: Secondary | ICD-10-CM

## 2013-12-05 DIAGNOSIS — Y929 Unspecified place or not applicable: Secondary | ICD-10-CM | POA: Diagnosis not present

## 2013-12-05 DIAGNOSIS — Y939 Activity, unspecified: Secondary | ICD-10-CM | POA: Insufficient documentation

## 2013-12-05 DIAGNOSIS — I10 Essential (primary) hypertension: Secondary | ICD-10-CM | POA: Insufficient documentation

## 2013-12-05 MED ORDER — DIPHENHYDRAMINE HCL 25 MG PO CAPS
25.0000 mg | ORAL_CAPSULE | Freq: Once | ORAL | Status: AC
  Administered 2013-12-05: 25 mg via ORAL
  Filled 2013-12-05: qty 1

## 2013-12-05 MED ORDER — IBUPROFEN 200 MG PO TABS
600.0000 mg | ORAL_TABLET | Freq: Once | ORAL | Status: AC
  Administered 2013-12-05: 600 mg via ORAL
  Filled 2013-12-05: qty 3

## 2013-12-05 NOTE — Discharge Instructions (Signed)
You can safely take regular doses of Ibuprofen and Benadryl for symptom control

## 2013-12-05 NOTE — ED Provider Notes (Signed)
CSN: [id number]     Arrival date & time 12/05/13  2257 History  This chart was scribed for non-physician practitioner, Junius Creamer, NP, working with Julianne Rice, MD, by Erling Conte, ED Scribe. This patient was seen in room WTR5/WTR5 and the patient's care was started at 11:37 PM.  Chief Complaint  Patient presents with  . Insect Bite    The history is provided by the patient. No language interpreter was used.   HPI Comments: Colleen [name] is a 58 y.o. female who presents to the Emergency Department complaining of bee stings that occurred about 3 hours ago. She states that the stings occurred on the back of her bilateral legs and back. She has 3 bee stings in total. She states she has not taken anything for the stings. She denies any shortness of breath, rash, wheezing, trouble swallowing or excessive swelling to the area.    Past Medical History  Diagnosis Date  . Hypertension   . Diabetes mellitus without complication   . Hemorrhoids    Past Surgical History  Procedure Laterality Date  . Appendectomy     No family history on file. History  Substance Use Topics  . Smoking status: Current Every Day Smoker -- 0.50 packs/day    Types: Cigarettes  . Smokeless tobacco: Not on file  . Alcohol Use: No   OB History   Grav Para Term Preterm Abortions TAB SAB Ect Mult Living                 Review of Systems  HENT: Negative for trouble swallowing.   Respiratory: Negative for cough, shortness of breath and wheezing.   Cardiovascular: Negative for chest pain and leg swelling.  Gastrointestinal: Negative for nausea and abdominal pain.  Skin: Positive for color change (redness to area of bee sting). Negative for rash.       Bee stings to back of right leg and back  Neurological: Negative for dizziness and headaches.  All other systems reviewed and are negative.     Allergies  Review of patient's allergies indicates no known allergies.  Home Medications   Prior to  Admission medications   Medication Sig Start Date End Date Taking? Authorizing Provider  docusate sodium (COLACE) 100 MG capsule Take 1 capsule (100 mg total) by mouth every 12 (twelve) hours. 11/21/13  Yes Evelina Bucy, MD  metFORMIN (GLUCOPHAGE) 500 MG tablet Take 500 mg by mouth at bedtime.   Yes Historical Provider, MD  telmisartan-hydrochlorothiazide (MICARDIS HCT) 40-12.5 MG per tablet Take 1 tablet by mouth daily.   Yes Historical Provider, MD   Triage Vitals: BP 138/82  Pulse 109  Temp(Src) 98.3 F (36.8 C) (Oral)  Resp 18  SpO2 100%  Physical Exam  Nursing note and vitals reviewed. Constitutional: She is oriented to person, place, and time. She appears well-developed and well-nourished. No distress.  HENT:  Head: Normocephalic and atraumatic.  Mouth/Throat: Oropharynx is clear and moist.  Eyes: Conjunctivae and EOM are normal.  Neck: Normal range of motion. Neck supple.  Cardiovascular: Normal rate.   Pulmonary/Chest: Effort normal. No respiratory distress.  Musculoskeletal: Normal range of motion.  Neurological: She is alert and oriented to person, place, and time.  Skin: Skin is warm and dry.  Small area of erythma approx 2 cm round at each insect/bee sting. One on back of each calf and one on posterior right shoulder.   Psychiatric: She has a normal mood and affect. Her behavior is normal.    ED  Course  Procedures (including critical care time)  DIAGNOSTIC STUDIES: Oxygen Saturation is 100% on RA, normal by my interpretation.    COORDINATION OF CARE: 11:42 PM- Will order Motrin and Benadryl.  Pt advised of plan for treatment and pt agrees.   Labs Review Labs Reviewed - No data to display  Imaging Review No results found.   EKG Interpretation None      MDM   Final diagnoses:  Bee sting, accidental or unintentional, initial encounter   patient has localized reaction to bee stings, no systemic reaction.  No shortness of breath, tachycardia, dizziness,  abdominal pain, nausea I personally performed the services described in this documentation, which was scribed in my presence. The recorded information has been reviewed and is accurate.    Garald Balding, NP 12/05/13 2348

## 2013-12-05 NOTE — ED Notes (Signed)
Pt c/o bee stings to R arm, back and bilateral legs. Pt states she was stung around 2030 tonight. Pt has some red areas where bites are. Pt has no acute distress. Pt states areas are burning.

## 2013-12-06 NOTE — ED Provider Notes (Signed)
Medical screening examination/treatment/procedure(s) were performed by non-physician practitioner and as supervising physician I was immediately available for consultation/collaboration.   EKG Interpretation None        Julianne Rice, MD 12/06/13 [phone]

## 2014-07-07 DIAGNOSIS — E780 Pure hypercholesterolemia: Secondary | ICD-10-CM | POA: Diagnosis not present

## 2014-07-07 DIAGNOSIS — E119 Type 2 diabetes mellitus without complications: Secondary | ICD-10-CM | POA: Diagnosis not present

## 2014-07-07 DIAGNOSIS — I10 Essential (primary) hypertension: Secondary | ICD-10-CM | POA: Diagnosis not present

## 2014-07-07 DIAGNOSIS — M130 Polyarthritis, unspecified: Secondary | ICD-10-CM | POA: Diagnosis not present

## 2014-07-20 ENCOUNTER — Other Ambulatory Visit: Payer: Self-pay

## 2014-07-20 DIAGNOSIS — Z1231 Encounter for screening mammogram for malignant neoplasm of breast: Secondary | ICD-10-CM

## 2014-08-16 ENCOUNTER — Ambulatory Visit
Admission: RE | Admit: 2014-08-16 | Discharge: 2014-08-16 | Disposition: A | Discharge status: Home / Self Care | Payer: Medicare Other | Source: Ambulatory Visit

## 2014-08-16 DIAGNOSIS — Z1231 Encounter for screening mammogram for malignant neoplasm of breast: Secondary | ICD-10-CM

## 2014-09-01 ENCOUNTER — Encounter (HOSPITAL_COMMUNITY): Payer: Self-pay | Admitting: Emergency Medicine

## 2014-09-01 ENCOUNTER — Emergency Department (HOSPITAL_COMMUNITY)
Admission: EM | Admit: 2014-09-01 | Discharge: 2014-09-01 | Disposition: A | Discharge status: Home / Self Care | Payer: Medicare Other | Attending: Emergency Medicine | Admitting: Emergency Medicine

## 2014-09-01 DIAGNOSIS — Y9389 Activity, other specified: Secondary | ICD-10-CM | POA: Diagnosis not present

## 2014-09-01 DIAGNOSIS — Z79899 Other long term (current) drug therapy: Secondary | ICD-10-CM | POA: Diagnosis not present

## 2014-09-01 DIAGNOSIS — Z720 Tobacco use: Secondary | ICD-10-CM | POA: Diagnosis not present

## 2014-09-01 DIAGNOSIS — R200 Anesthesia of skin: Secondary | ICD-10-CM | POA: Diagnosis not present

## 2014-09-01 DIAGNOSIS — Y9289 Other specified places as the place of occurrence of the external cause: Secondary | ICD-10-CM | POA: Insufficient documentation

## 2014-09-01 DIAGNOSIS — Y998 Other external cause status: Secondary | ICD-10-CM | POA: Diagnosis not present

## 2014-09-01 DIAGNOSIS — X58XXXA Exposure to other specified factors, initial encounter: Secondary | ICD-10-CM | POA: Diagnosis not present

## 2014-09-01 DIAGNOSIS — T783XXA Angioneurotic edema, initial encounter: Secondary | ICD-10-CM | POA: Insufficient documentation

## 2014-09-01 DIAGNOSIS — R220 Localized swelling, mass and lump, head: Secondary | ICD-10-CM | POA: Diagnosis present

## 2014-09-01 DIAGNOSIS — Z8719 Personal history of other diseases of the digestive system: Secondary | ICD-10-CM | POA: Diagnosis not present

## 2014-09-01 DIAGNOSIS — E119 Type 2 diabetes mellitus without complications: Secondary | ICD-10-CM | POA: Diagnosis not present

## 2014-09-01 DIAGNOSIS — I10 Essential (primary) hypertension: Secondary | ICD-10-CM | POA: Diagnosis not present

## 2014-09-01 MED ORDER — DIPHENHYDRAMINE HCL 25 MG PO TABS: 25.0000 mg | ORAL_TABLET | Freq: Four times a day (QID) | ORAL | Status: AC

## 2014-09-01 MED ORDER — FAMOTIDINE 20 MG PO TABS
20.0000 mg | ORAL_TABLET | Freq: Once | ORAL | Status: AC
Start: 2014-09-01 — End: 2014-09-01
  Administered 2014-09-01: 20 mg via ORAL
  Filled 2014-09-01: qty 1

## 2014-09-01 MED ORDER — DIPHENHYDRAMINE HCL 25 MG PO CAPS
25.0000 mg | ORAL_CAPSULE | ORAL | Status: AC
  Administered 2014-09-01: 25 mg via ORAL
  Filled 2014-09-01: qty 1

## 2014-09-01 MED ORDER — PREDNISONE 20 MG PO TABS
60.0000 mg | ORAL_TABLET | Freq: Once | ORAL | Status: AC
  Administered 2014-09-01: 60 mg via ORAL
  Filled 2014-09-01: qty 3

## 2014-09-01 MED ORDER — PREDNISONE 20 MG PO TABS: 40.0000 mg | ORAL_TABLET | Freq: Every day | ORAL | Status: DC

## 2014-09-01 MED ORDER — FAMOTIDINE 20 MG PO TABS: 20.0000 mg | ORAL_TABLET | Freq: Two times a day (BID) | ORAL | Status: DC

## 2014-09-01 NOTE — Discharge Instructions (Signed)
Please follow the directions provider. P sure to follow-up with Dr. Criss Rosales regarding her blood pressure medicines. He should stop taking her current blood pressure medicine, to avoid worsening lip or mouth swelling. Take prednisone daily for 5 days. Take the Pepcid daily for 5 days. Take the Benadryl every 6 hours to help with swelling. Don't hesitate to return for, resting, or concerning symptoms.   SEEK IMMEDIATE MEDICAL CARE IF:  You have severe swelling of the mouth, tongue, or lips.  You have difficulty breathing.  You have difficulty swallowing.  You faint.

## 2014-09-01 NOTE — ED Provider Notes (Signed)
CSN: [id number]     Arrival date & time 09/01/14  1856 History  This chart was scribed for non-physician provider Britt Bottom, NP-C, working with Malvin Johns, MD by Irene Pap, ED Scribe. This patient was seen in room WTR5/WTR5 and patient care was started at 8:12 PM.     Chief Complaint  Patient presents with  . Facial Swelling   The history is provided by the patient. No language interpreter was used.    HPI Comments: Colleen [name] is a 59 y.o. female with a history of HTN who presents to the Emergency Department complaining of constant left facial swelling, numbness and tingling onset 7 hours ago. She states that she last took her HTN medication today. She denies any insect stings to the area, changes in medication, cough, abdominal pain or SOB. She denies any symptoms prior to 1 PM. Patient reports unassociated cough onset 2 weeks ago.   Past Medical History  Diagnosis Date  . Hypertension   . Diabetes mellitus without complication   . Hemorrhoids    Past Surgical History  Procedure Laterality Date  . Appendectomy     History reviewed. No pertinent family history. History  Substance Use Topics  . Smoking status: Current Every Day Smoker -- 0.50 packs/day    Types: Cigarettes  . Smokeless tobacco: Not on file  . Alcohol Use: No   OB History    No data available     Review of Systems  Constitutional: Negative for fever and chills.  HENT: Positive for facial swelling. Negative for sore throat.   Eyes: Negative for visual disturbance.  Respiratory: Negative for cough and shortness of breath.   Cardiovascular: Negative for chest pain and leg swelling.  Gastrointestinal: Negative for nausea, vomiting, abdominal pain and diarrhea.  Genitourinary: Negative for dysuria.  Musculoskeletal: Negative for myalgias.  Skin: Negative for rash.  Neurological: Positive for numbness. Negative for weakness and headaches.   Allergies  Review of patient's allergies  indicates no known allergies.  Home Medications   Prior to Admission medications   Medication Sig Start Date End Date Taking? Authorizing Provider  docusate sodium (COLACE) 100 MG capsule Take 1 capsule (100 mg total) by mouth every 12 (twelve) hours. 11/21/13   Evelina Bucy, MD  metFORMIN (GLUCOPHAGE) 500 MG tablet Take 500 mg by mouth at bedtime.    Historical Provider, MD  telmisartan-hydrochlorothiazide (MICARDIS HCT) 40-12.5 MG per tablet Take 1 tablet by mouth daily.    Historical Provider, MD   BP 172/92 mmHg  Pulse 66  Temp(Src) 97.9 F (36.6 C) (Oral)  SpO2 96%   Physical Exam  Constitutional: She is oriented to person, place, and time. She appears well-developed and well-nourished. No distress.  HENT:  Head: Normocephalic and atraumatic.  Mouth/Throat: Oropharynx is clear and moist. No oropharyngeal exudate.  Moderate unilateral swelling to left upper lip; no oropharyngeal swelling; no voice change  Eyes: Conjunctivae and EOM are normal.  Neck: Normal range of motion. Neck supple. No thyromegaly present.  Cardiovascular: Normal rate, regular rhythm, normal heart sounds and intact distal pulses.   Pulmonary/Chest: Effort normal and breath sounds normal. No respiratory distress. She has no decreased breath sounds. She has no wheezes. She has no rhonchi. She has no rales. She exhibits no tenderness.  Abdominal: Soft. There is no tenderness.  Musculoskeletal: Normal range of motion. She exhibits no edema or tenderness.  Lymphadenopathy:    She has no cervical adenopathy.  Neurological: She is alert and oriented to person, place,  and time. No sensory deficit.  Skin: Skin is warm and dry. No rash noted. She is not diaphoretic.  Psychiatric: She has a normal mood and affect. Her behavior is normal.  Nursing note and vitals reviewed.   ED Course  Procedures (including critical care time) DIAGNOSTIC STUDIES: Oxygen Saturation is 96% on room air, room air by my interpretation.     COORDINATION OF CARE: 8:15 PM-Discussed treatment plan which includes stopping HTN medication, albuterol inhaler, and follow up with PCP with pt at bedside and pt agreed to plan.   Labs Review Labs Reviewed - No data to display  Imaging Review No results found.   EKG Interpretation None      MDM   Final diagnoses:  Angioedema of lips, initial encounter   59 yo with unilateral lip swelling with history of ARB use. Presentation consistent with angioedema. No indication of dental involvement, infection or abscess. She has no oral floor swelling and no oropharyngeal swelling.  Her lungs are clear and she has no shortness of breath.  She was treated with benedryl and prednisone and monitored in the ED. Her lip swelling improved and she continues to be in no acute distress. Pt is well-appearing, in no acute distress and vital signs reviewed and not concerning. She appears safe to be discharged.  Discharge include follow-up with their PCP to discuss bp meds.  Return precautions provided. Pt aware of plan and in agreement. Case discussed case with Dr. Tamera Punt.   I personally performed the services described in this documentation, which was scribed in my presence. The recorded information has been reviewed and is accurate.  Filed Vitals:   09/01/14 1908 09/01/14 2018 09/01/14 2152  BP: 172/92 141/76 152/76  Pulse: 66 70 63  Temp: 97.9 F (36.6 C)    TempSrc: Oral    Resp:  18 18  SpO2: 96% 97% 97%   Meds given in ED:  Medications  famotidine (PEPCID) tablet 20 mg (20 mg Oral Given 09/01/14 2032)  diphenhydrAMINE (BENADRYL) capsule 25 mg (25 mg Oral Given 09/01/14 2032)  predniSONE (DELTASONE) tablet 60 mg (60 mg Oral Given 09/01/14 2033)    Discharge Medication List as of 09/01/2014  9:45 PM    START taking these medications   Details  diphenhydrAMINE (BENADRYL) 25 MG tablet Take 1 tablet (25 mg total) by mouth every 6 (six) hours., Starting 09/01/2014, Until Discontinued, Print     famotidine (PEPCID) 20 MG tablet Take 1 tablet (20 mg total) by mouth 2 (two) times daily., Starting 09/01/2014, Until Discontinued, Print    predniSONE (DELTASONE) 20 MG tablet Take 2 tablets (40 mg total) by mouth daily., Starting 09/01/2014, Until Discontinued, Print          Britt Bottom, NP 09/02/14 Why, MD 09/03/14 [phone]

## 2014-09-01 NOTE — ED Notes (Signed)
Pt states that when she woke up at 1pm she noticed that she has swelling, numbness and tingling on her L side of her face. Alert and oriented.

## 2014-09-02 DIAGNOSIS — R609 Edema, unspecified: Secondary | ICD-10-CM | POA: Diagnosis not present

## 2014-09-06 ENCOUNTER — Emergency Department (HOSPITAL_COMMUNITY)
Admission: EM | Admit: 2014-09-06 | Discharge: 2014-09-06 | Disposition: A | Discharge status: Home / Self Care | Payer: Medicare Other | Attending: Emergency Medicine | Admitting: Emergency Medicine

## 2014-09-06 ENCOUNTER — Encounter (HOSPITAL_COMMUNITY): Payer: Self-pay | Admitting: Emergency Medicine

## 2014-09-06 DIAGNOSIS — L500 Allergic urticaria: Secondary | ICD-10-CM | POA: Insufficient documentation

## 2014-09-06 DIAGNOSIS — T380X5A Adverse effect of glucocorticoids and synthetic analogues, initial encounter: Secondary | ICD-10-CM | POA: Insufficient documentation

## 2014-09-06 DIAGNOSIS — T7840XA Allergy, unspecified, initial encounter: Secondary | ICD-10-CM | POA: Diagnosis not present

## 2014-09-06 DIAGNOSIS — I10 Essential (primary) hypertension: Secondary | ICD-10-CM | POA: Insufficient documentation

## 2014-09-06 DIAGNOSIS — T470X5A Adverse effect of histamine H2-receptor blockers, initial encounter: Secondary | ICD-10-CM | POA: Diagnosis not present

## 2014-09-06 DIAGNOSIS — Z8719 Personal history of other diseases of the digestive system: Secondary | ICD-10-CM | POA: Insufficient documentation

## 2014-09-06 DIAGNOSIS — R21 Rash and other nonspecific skin eruption: Secondary | ICD-10-CM | POA: Diagnosis present

## 2014-09-06 DIAGNOSIS — E119 Type 2 diabetes mellitus without complications: Secondary | ICD-10-CM | POA: Insufficient documentation

## 2014-09-06 DIAGNOSIS — Z720 Tobacco use: Secondary | ICD-10-CM | POA: Insufficient documentation

## 2014-09-06 DIAGNOSIS — Z79899 Other long term (current) drug therapy: Secondary | ICD-10-CM | POA: Diagnosis not present

## 2014-09-06 MED ORDER — DIPHENHYDRAMINE HCL 50 MG/ML IJ SOLN
50.0000 mg | Freq: Once | INTRAMUSCULAR | Status: AC
  Administered 2014-09-06: 50 mg via INTRAMUSCULAR
  Filled 2014-09-06: qty 1

## 2014-09-06 MED ORDER — DEXAMETHASONE SODIUM PHOSPHATE 10 MG/ML IJ SOLN
10.0000 mg | Freq: Once | INTRAMUSCULAR | Status: AC
  Administered 2014-09-06: 10 mg via INTRAMUSCULAR
  Filled 2014-09-06: qty 1

## 2014-09-06 NOTE — ED Notes (Signed)
Patient c/o rash on arms and face onset yesterday 1600, pt states she began taking prednisone, famotidine, and diphenhydramine on Friday, which was prescribed for angioedema s/t antihypertension medications.

## 2014-09-06 NOTE — Discharge Instructions (Signed)
Stop taking the Pepcid and use generic Benadryl as directed for itching and rash Allergies Allergies may happen from anything your body is sensitive to. This may be food, medicines, pollens, chemicals, and nearly anything around you in everyday life that produces allergens. An allergen is anything that causes an allergy producing substance. Heredity is often a factor in causing these problems. This means you may have some of the same allergies as your parents. Food allergies happen in all age groups. Food allergies are some of the most severe and life threatening. Some common food allergies are cow's milk, seafood, eggs, nuts, wheat, and soybeans. SYMPTOMS   Swelling around the mouth.  An itchy red rash or hives.  Vomiting or diarrhea.  Difficulty breathing. SEVERE ALLERGIC REACTIONS ARE LIFE-THREATENING. This reaction is called anaphylaxis. It can cause the mouth and throat to swell and cause difficulty with breathing and swallowing. In severe reactions only a trace amount of food (for example, peanut oil in a salad) may cause death within seconds. Seasonal allergies occur in all age groups. These are seasonal because they usually occur during the same season every year. They may be a reaction to molds, grass pollens, or tree pollens. Other causes of problems are house dust mite allergens, pet dander, and mold spores. The symptoms often consist of nasal congestion, a runny itchy nose associated with sneezing, and tearing itchy eyes. There is often an associated itching of the mouth and ears. The problems happen when you come in contact with pollens and other allergens. Allergens are the particles in the air that the body reacts to with an allergic reaction. This causes you to release allergic antibodies. Through a chain of events, these eventually cause you to release histamine into the blood stream. Although it is meant to be protective to the body, it is this release that causes your discomfort.  This is why you were given anti-histamines to feel better. If you are unable to pinpoint the offending allergen, it may be determined by skin or blood testing. Allergies cannot be cured but can be controlled with medicine. Hay fever is a collection of all or some of the seasonal allergy problems. It may often be treated with simple over-the-counter medicine such as diphenhydramine. Take medicine as directed. Do not drink alcohol or drive while taking this medicine. Check with your caregiver or package insert for child dosages. If these medicines are not effective, there are many new medicines your caregiver can prescribe. Stronger medicine such as nasal spray, eye drops, and corticosteroids may be used if the first things you try do not work well. Other treatments such as immunotherapy or desensitizing injections can be used if all else fails. Follow up with your caregiver if problems continue. These seasonal allergies are usually not life threatening. They are generally more of a nuisance that can often be handled using medicine. HOME CARE INSTRUCTIONS   If unsure what causes a reaction, keep a diary of foods eaten and symptoms that follow. Avoid foods that cause reactions.  If hives or rash are present:  Take medicine as directed.  You may use an over-the-counter antihistamine (diphenhydramine) for hives and itching as needed.  Apply cold compresses (cloths) to the skin or take baths in cool water. Avoid hot baths or showers. Heat will make a rash and itching worse.  If you are severely allergic:  Following a treatment for a severe reaction, hospitalization is often required for closer follow-up.  Wear a medic-alert bracelet or necklace stating the  allergy.  You and your family must learn how to give adrenaline or use an anaphylaxis kit.  If you have had a severe reaction, always carry your anaphylaxis kit or EpiPen with you. Use this medicine as directed by your caregiver if a severe  reaction is occurring. Failure to do so could have a fatal outcome. SEEK MEDICAL CARE IF:  You suspect a food allergy. Symptoms generally happen within 30 minutes of eating a food.  Your symptoms have not gone away within 2 days or are getting worse.  You develop new symptoms.  You want to retest yourself or your child with a food or drink you think causes an allergic reaction. Never do this if an anaphylactic reaction to that food or drink has happened before. Only do this under the care of a caregiver. SEEK IMMEDIATE MEDICAL CARE IF:   You have difficulty breathing, are wheezing, or have a tight feeling in your chest or throat.  You have a swollen mouth, or you have hives, swelling, or itching all over your body.  You have had a severe reaction that has responded to your anaphylaxis kit or an EpiPen. These reactions may return when the medicine has worn off. These reactions should be considered life threatening. MAKE SURE YOU:   Understand these instructions.  Will watch your condition.  Will get help right away if you are not doing well or get worse. Document Released: 07/10/2002 Document Revised: 08/11/2012 Document Reviewed: 12/15/2007 [facility] Patient Information 2015 [place], [place]. This information is not intended to replace advice given to you by your health care provider. Make sure you discuss any questions you have with your health care provider.

## 2014-09-06 NOTE — ED Notes (Signed)
I called patient 3 times and no show

## 2014-09-06 NOTE — ED Notes (Signed)
Pt states has a rash to arms and face that started yesterday and is getting worse, states very itchy, unsure of which medication is causing this rash. Pt in no distress.

## 2014-09-06 NOTE — ED Provider Notes (Signed)
CSN: [id number]     Arrival date & time 09/06/14  1630 History   First MD Initiated Contact with Patient 09/06/14 1948     Chief Complaint  Patient presents with  . Rash     (Consider location/radiation/quality/duration/timing/severity/associated sxs/prior Treatment) HPI Comments: Patient here complaining of pruritic rash that started yesterday and getting worse. Treated for angioedema with prednisone and Pepcid and Benadryl on Friday. Denies any trouble swallowing. She is not short of breath. Rash started on her arms and now spread to her face. Denies any new chemical exposure other than the medications that she was prescribed. Symptoms persistent and nothing makes them better.  Patient is a 59 y.o. female presenting with rash. The history is provided by the patient.  Rash   Past Medical History  Diagnosis Date  . Hypertension   . Diabetes mellitus without complication   . Hemorrhoids    Past Surgical History  Procedure Laterality Date  . Appendectomy     History reviewed. No pertinent family history. History  Substance Use Topics  . Smoking status: Current Every Day Smoker -- 0.50 packs/day    Types: Cigarettes  . Smokeless tobacco: Not on file  . Alcohol Use: No   OB History    No data available     Review of Systems  Skin: Positive for rash.  All other systems reviewed and are negative.     Allergies  Review of patient's allergies indicates no known allergies.  Home Medications   Prior to Admission medications   Medication Sig Start Date End Date Taking? Authorizing Provider  diphenhydrAMINE (BENADRYL) 25 MG tablet Take 1 tablet (25 mg total) by mouth every 6 (six) hours. Patient taking differently: Take 25 mg by mouth every 6 (six) hours as needed for itching.  09/01/14  Yes Britt Bottom, NP  docusate sodium (COLACE) 100 MG capsule Take 1 capsule (100 mg total) by mouth every 12 (twelve) hours. Patient taking differently: Take 100 mg by mouth daily as  needed for mild constipation (constipation).  11/21/13  Yes Evelina Bucy, MD  famotidine (PEPCID) 20 MG tablet Take 1 tablet (20 mg total) by mouth 2 (two) times daily. 09/01/14  Yes Britt Bottom, NP  metFORMIN (GLUCOPHAGE) 500 MG tablet Take 500 mg by mouth at bedtime.   Yes Historical Provider, MD  predniSONE (DELTASONE) 20 MG tablet Take 2 tablets (40 mg total) by mouth daily. 09/01/14  Yes Britt Bottom, NP   BP 146/99 mmHg  Pulse 75  Temp(Src) 97.9 F (36.6 C) (Oral)  Resp 18  SpO2 100% Physical Exam  Constitutional: She is oriented to person, place, and time. She appears well-developed and well-nourished.  Non-toxic appearance. No distress.  HENT:  Head: Normocephalic and atraumatic.  No intraoral involvement  Eyes: Conjunctivae, EOM and lids are normal. Pupils are equal, round, and reactive to light.  Neck: Normal range of motion. Neck supple. No tracheal deviation present. No thyroid mass present.  Cardiovascular: Normal rate, regular rhythm and normal heart sounds.  Exam reveals no gallop.   No murmur heard. Pulmonary/Chest: Effort normal and breath sounds normal. No stridor. No respiratory distress. She has no decreased breath sounds. She has no wheezes. She has no rhonchi. She has no rales.  Abdominal: Soft. Normal appearance and bowel sounds are normal. She exhibits no distension. There is no tenderness. There is no rebound and no CVA tenderness.  Musculoskeletal: Normal range of motion. She exhibits no edema or tenderness.  Neurological: She is alert and oriented  to person, place, and time. She has normal strength. No cranial nerve deficit or sensory deficit. GCS eye subscore is 4. GCS verbal subscore is 5. GCS motor subscore is 6.  Skin: Skin is warm and dry. Rash noted. No abrasion noted. Rash is urticarial.  Psychiatric: She has a normal mood and affect. Her speech is normal and behavior is normal.  Nursing note and vitals reviewed.   ED Course  Procedures  (including critical care time) Labs Review Labs Reviewed - No data to display  Imaging Review No results found.   EKG Interpretation None      MDM   Final diagnoses:  None    Patient given Benadryl and Decadron and feels better. Rash is greatly improved. Recommend patient use generic Benadryl and have her complete her course of prednisone    Lacretia Leigh, MD 09/06/14 2135

## 2014-09-09 DIAGNOSIS — I10 Essential (primary) hypertension: Secondary | ICD-10-CM | POA: Diagnosis not present

## 2014-09-21 ENCOUNTER — Other Ambulatory Visit (HOSPITAL_COMMUNITY)
Admission: RE | Admit: 2014-09-21 | Discharge: 2014-09-21 | Disposition: A | Discharge status: Home / Self Care | Payer: Medicare Other | Source: Ambulatory Visit | Attending: Family Medicine | Admitting: Family Medicine

## 2014-09-21 ENCOUNTER — Other Ambulatory Visit: Payer: Self-pay | Admitting: Family Medicine

## 2014-09-21 DIAGNOSIS — Z1151 Encounter for screening for human papillomavirus (HPV): Secondary | ICD-10-CM | POA: Insufficient documentation

## 2014-09-21 DIAGNOSIS — R87810 Cervical high risk human papillomavirus (HPV) DNA test positive: Secondary | ICD-10-CM | POA: Insufficient documentation

## 2014-09-21 DIAGNOSIS — Z01419 Encounter for gynecological examination (general) (routine) without abnormal findings: Secondary | ICD-10-CM | POA: Diagnosis not present

## 2014-09-21 DIAGNOSIS — Z0000 Encounter for general adult medical examination without abnormal findings: Secondary | ICD-10-CM | POA: Diagnosis not present

## 2014-09-23 LAB — CYTOLOGY - PAP

## 2014-12-29 DIAGNOSIS — I10 Essential (primary) hypertension: Secondary | ICD-10-CM | POA: Diagnosis not present

## 2015-01-28 DIAGNOSIS — I10 Essential (primary) hypertension: Secondary | ICD-10-CM | POA: Diagnosis not present

## 2015-06-23 DIAGNOSIS — F70 Mild intellectual disabilities: Secondary | ICD-10-CM | POA: Diagnosis not present

## 2015-06-23 DIAGNOSIS — I10 Essential (primary) hypertension: Secondary | ICD-10-CM | POA: Diagnosis not present

## 2015-06-23 DIAGNOSIS — Z6833 Body mass index (BMI) 33.0-33.9, adult: Secondary | ICD-10-CM | POA: Diagnosis not present

## 2015-06-23 DIAGNOSIS — E782 Mixed hyperlipidemia: Secondary | ICD-10-CM | POA: Diagnosis not present

## 2015-06-23 DIAGNOSIS — E7800 Pure hypercholesterolemia, unspecified: Secondary | ICD-10-CM | POA: Diagnosis not present

## 2015-06-23 DIAGNOSIS — R7309 Other abnormal glucose: Secondary | ICD-10-CM | POA: Diagnosis not present

## 2015-08-02 ENCOUNTER — Other Ambulatory Visit: Payer: Self-pay

## 2015-08-02 DIAGNOSIS — Z1231 Encounter for screening mammogram for malignant neoplasm of breast: Secondary | ICD-10-CM

## 2015-08-22 ENCOUNTER — Ambulatory Visit
Admission: RE | Admit: 2015-08-22 | Discharge: 2015-08-22 | Disposition: A | Discharge status: Home / Self Care | Payer: Medicare Other | Source: Ambulatory Visit

## 2015-08-22 DIAGNOSIS — Z1231 Encounter for screening mammogram for malignant neoplasm of breast: Secondary | ICD-10-CM | POA: Diagnosis not present

## 2015-09-17 ENCOUNTER — Encounter (HOSPITAL_COMMUNITY): Payer: Self-pay | Admitting: Emergency Medicine

## 2015-09-17 ENCOUNTER — Emergency Department (HOSPITAL_COMMUNITY): Payer: Medicare Other

## 2015-09-17 ENCOUNTER — Emergency Department (HOSPITAL_COMMUNITY)
Admission: EM | Admit: 2015-09-17 | Discharge: 2015-09-17 | Disposition: A | Discharge status: Home / Self Care | Payer: Medicare Other | Attending: Emergency Medicine | Admitting: Emergency Medicine

## 2015-09-17 DIAGNOSIS — R404 Transient alteration of awareness: Secondary | ICD-10-CM | POA: Diagnosis not present

## 2015-09-17 DIAGNOSIS — H9209 Otalgia, unspecified ear: Secondary | ICD-10-CM | POA: Diagnosis not present

## 2015-09-17 DIAGNOSIS — E119 Type 2 diabetes mellitus without complications: Secondary | ICD-10-CM | POA: Insufficient documentation

## 2015-09-17 DIAGNOSIS — H6591 Unspecified nonsuppurative otitis media, right ear: Secondary | ICD-10-CM

## 2015-09-17 DIAGNOSIS — F17210 Nicotine dependence, cigarettes, uncomplicated: Secondary | ICD-10-CM | POA: Diagnosis not present

## 2015-09-17 DIAGNOSIS — H7090 Unspecified mastoiditis, unspecified ear: Secondary | ICD-10-CM

## 2015-09-17 DIAGNOSIS — I10 Essential (primary) hypertension: Secondary | ICD-10-CM | POA: Diagnosis not present

## 2015-09-17 DIAGNOSIS — R42 Dizziness and giddiness: Secondary | ICD-10-CM

## 2015-09-17 DIAGNOSIS — Z7984 Long term (current) use of oral hypoglycemic drugs: Secondary | ICD-10-CM | POA: Diagnosis not present

## 2015-09-17 DIAGNOSIS — H938X1 Other specified disorders of right ear: Secondary | ICD-10-CM | POA: Insufficient documentation

## 2015-09-17 DIAGNOSIS — J0100 Acute maxillary sinusitis, unspecified: Secondary | ICD-10-CM | POA: Diagnosis not present

## 2015-09-17 DIAGNOSIS — H9201 Otalgia, right ear: Secondary | ICD-10-CM | POA: Diagnosis not present

## 2015-09-17 LAB — BASIC METABOLIC PANEL
ANION GAP: 8 (ref 5–15)
BUN: 11 mg/dL (ref 6–20)
CHLORIDE: 101 mmol/L (ref 101–111)
CO2: 26 mmol/L (ref 22–32)
Calcium: 9.2 mg/dL (ref 8.9–10.3)
Creatinine, Ser: 0.87 mg/dL (ref 0.44–1.00)
GFR calc Af Amer: >60 mL/min (ref >60)
GFR calc non Af Amer: >60 mL/min (ref >60)
GLUCOSE: 105 mg/dL — AB (ref 65–99)
Potassium: 3.6 mmol/L (ref 3.5–5.1)
Sodium: 135 mmol/L (ref 135–145)

## 2015-09-17 LAB — CBC
HEMATOCRIT: 34.4 % — AB (ref 36.0–46.0)
Hemoglobin: 11.4 g/dL — ABNORMAL LOW (ref 12.0–15.0)
MCH: 27.9 pg (ref 26.0–34.0)
MCHC: 33.1 g/dL (ref 30.0–36.0)
MCV: 84.3 fL (ref 78.0–100.0)
Platelets: 202 10*3/uL (ref 150–400)
RBC: 4.08 MIL/uL (ref 3.87–5.11)
RDW: 13.5 % (ref 11.5–15.5)
WBC: 6.5 10*3/uL (ref 4.0–10.5)

## 2015-09-17 MED ORDER — PSEUDOEPHEDRINE HCL 60 MG PO TABS: 60.0000 mg | ORAL_TABLET | ORAL | Status: AC | PRN

## 2015-09-17 MED ORDER — MECLIZINE HCL 25 MG PO TABS
25.0000 mg | ORAL_TABLET | Freq: Once | ORAL | Status: AC
  Administered 2015-09-17: 25 mg via ORAL
  Filled 2015-09-17: qty 1

## 2015-09-17 MED ORDER — IOPAMIDOL (ISOVUE-300) INJECTION 61%
75.0000 mL | Freq: Once | INTRAVENOUS | Status: AC | PRN
  Administered 2015-09-17: 75 mL via INTRAVENOUS

## 2015-09-17 MED ORDER — AMOXICILLIN-POT CLAVULANATE 875-125 MG PO TABS: 1.0000 | ORAL_TABLET | Freq: Two times a day (BID) | ORAL | Status: AC

## 2015-09-17 MED ORDER — FLUTICASONE PROPIONATE 50 MCG/ACT NA SUSP: 2.0000 | Freq: Every day | NASAL | Status: AC

## 2015-09-17 NOTE — ED Notes (Signed)
Bed: [id number] Expected date: 09/17/15 Expected time: 4:28 PM Means of arrival: Ambulance Comments: Cold symptoms

## 2015-09-17 NOTE — Discharge Instructions (Signed)
Sinusitis, Adult °Sinusitis is redness, soreness, and inflammation of the paranasal sinuses. Paranasal sinuses are air pockets within the bones of your face. They are located beneath your eyes, in the middle of your forehead, and above your eyes. In healthy paranasal sinuses, mucus is able to drain out, and air is able to circulate through them by way of your nose. However, when your paranasal sinuses are inflamed, mucus and air can become trapped. This can allow bacteria and other germs to grow and cause infection. °Sinusitis can develop quickly and last only a short time (acute) or continue over a long period (chronic). Sinusitis that lasts for more than 12 weeks is considered chronic. °CAUSES °Causes of sinusitis include: °· Allergies. °· Structural abnormalities, such as displacement of the cartilage that separates your nostrils (deviated septum), which can decrease the air flow through your nose and sinuses and affect sinus drainage. °· Functional abnormalities, such as when the small hairs (cilia) that line your sinuses and help remove mucus do not work properly or are not present. °SIGNS AND SYMPTOMS °Symptoms of acute and chronic sinusitis are the same. The primary symptoms are pain and pressure around the affected sinuses. Other symptoms include: °· Upper toothache. °· Earache. °· Headache. °· Bad breath. °· Decreased sense of smell and taste. °· A cough, which worsens when you are lying flat. °· Fatigue. °· Fever. °· Thick drainage from your nose, which often is green and may contain pus (purulent). °· Swelling and warmth over the affected sinuses. °DIAGNOSIS °Your health care provider will perform a physical exam. During your exam, your health care provider may perform any of the following to help determine if you have acute sinusitis or chronic sinusitis: °· Look in your nose for signs of abnormal growths in your nostrils (nasal polyps). °· Tap over the affected sinus to check for signs of  infection. °· View the inside of your sinuses using an imaging device that has a light attached (endoscope). °If your health care provider suspects that you have chronic sinusitis, one or more of the following tests may be recommended: °· Allergy tests. °· Nasal culture. A sample of mucus is taken from your nose, sent to a lab, and screened for bacteria. °· Nasal cytology. A sample of mucus is taken from your nose and examined by your health care provider to determine if your sinusitis is related to an allergy. °TREATMENT °Most cases of acute sinusitis are related to a viral infection and will resolve on their own within 10 days. Sometimes, medicines are prescribed to help relieve symptoms of both acute and chronic sinusitis. These may include pain medicines, decongestants, nasal steroid sprays, or saline sprays. °However, for sinusitis related to a bacterial infection, your health care provider will prescribe antibiotic medicines. These are medicines that will help kill the bacteria causing the infection. °Rarely, sinusitis is caused by a fungal infection. In these cases, your health care provider will prescribe antifungal medicine. °For some cases of chronic sinusitis, surgery is needed. Generally, these are cases in which sinusitis recurs more than 3 times per year, despite other treatments. °HOME CARE INSTRUCTIONS °· Drink plenty of water. Water helps thin the mucus so your sinuses can drain more easily. °· Use a humidifier. °· Inhale steam 3-4 times a day (for example, sit in the bathroom with the shower running). °· Apply a warm, moist washcloth to your face 3-4 times a day, or as directed by your health care provider. °· Use saline nasal sprays to help   moisten and clean your sinuses.  Take medicines only as directed by your health care provider.  If you were prescribed either an antibiotic or antifungal medicine, finish it all even if you start to feel better. SEEK IMMEDIATE MEDICAL CARE IF:  You have  increasing pain or severe headaches.  You have nausea, vomiting, or drowsiness.  You have swelling around your face.  You have vision problems.  You have a stiff neck.  You have difficulty breathing.   This information is not intended to replace advice given to you by your health care provider. Make sure you discuss any questions you have with your health care provider.   Document Released: 04/16/2005 Document Revised: 05/07/2014 Document Reviewed: 05/01/2011 Elsevier Interactive Patient Education 2016 Elsevier Inc.  Dizziness Dizziness is a common problem. It is a feeling of unsteadiness or light-headedness. You may feel like you are about to faint. Dizziness can lead to injury if you stumble or fall. Anyone can become dizzy, but dizziness is more common in older adults. This condition can be caused by a number of things, including medicines, dehydration, or illness. HOME CARE INSTRUCTIONS Taking these steps may help with your condition: Eating and Drinking  Drink enough fluid to keep your urine clear or pale yellow. This helps to keep you from becoming dehydrated. Try to drink more clear fluids, such as water.  Do not drink alcohol.  Limit your caffeine intake if directed by your health care provider.  Limit your salt intake if directed by your health care provider. Activity  Avoid making quick movements.  Rise slowly from chairs and steady yourself until you feel okay.  In the morning, first sit up on the side of the bed. When you feel okay, stand slowly while you hold onto something until you know that your balance is fine.  Move your legs often if you need to stand in one place for a long time. Tighten and relax your muscles in your legs while you are standing.  Do not drive or operate heavy machinery if you feel dizzy.  Avoid bending down if you feel dizzy. Place items in your home so that they are easy for you to reach without leaning over. Lifestyle  Do not use  any tobacco products, including cigarettes, chewing tobacco, or electronic cigarettes. If you need help quitting, ask your health care provider.  Try to reduce your stress level, such as with yoga or meditation. Talk with your health care provider if you need help. General Instructions  Watch your dizziness for any changes.  Take medicines only as directed by your health care provider. Talk with your health care provider if you think that your dizziness is caused by a medicine that you are taking.  Tell a friend or a family member that you are feeling dizzy. If he or she notices any changes in your behavior, have this person call your health care provider.  Keep all follow-up visits as directed by your health care provider. This is important. SEEK MEDICAL CARE IF:  Your dizziness does not go away.  Your dizziness or light-headedness gets worse.  You feel nauseous.  You have reduced hearing.  You have new symptoms.  You are unsteady on your feet or you feel like the room is spinning. SEEK IMMEDIATE MEDICAL CARE IF:  You vomit or have diarrhea and are unable to eat or drink anything.  You have problems talking, walking, swallowing, or using your arms, hands, or legs.  You feel generally weak.  You are not thinking clearly or you have trouble forming sentences. It may take a friend or family member to notice this.  You have chest pain, abdominal pain, shortness of breath, or sweating.  Your vision changes.  You notice any bleeding.  You have a headache.  You have neck pain or a stiff neck.  You have a fever.   This information is not intended to replace advice given to you by your health care provider. Make sure you discuss any questions you have with your health care provider.   Document Released: 10/10/2000 Document Revised: 08/31/2014 Document Reviewed: 04/12/2014 Elsevier Interactive Patient Education [employer].

## 2015-09-17 NOTE — ED Provider Notes (Signed)
CSN: [id number]     Arrival date & time 09/17/15  1631 History   By signing my name below, I, Randa Evens, attest that this documentation has been prepared under the direction and in the presence of Emanuelle Bastos Y Emmilee Reamer, [place]. Electronically Signed: Randa Evens, ED Scribe. 09/17/2015. 5:17 PM.      Chief Complaint  Patient presents with  . Otalgia  . Dizziness    Patient is a 60 y.o. female presenting with ear pain and dizziness. The history is provided by the patient. No language interpreter was used.  Otalgia Associated symptoms: cough   Associated symptoms: no fever, no hearing loss and no tinnitus   Dizziness Associated symptoms: no hearing loss, no tinnitus and no weakness    HPI Comments: Colleen [name] is a 60 y.o. female who presents to the Emergency Department complaining of right ear pain onset 2 weeks prior. Pt reports associated ear fullness and dizziness. She describes the dizziness and the room spinning. She states the dizziness is worse when ambulating. Pt reports her symptoms began with URI symptoms. She states that her URI symptoms have resolved but her ear fullness has not resolved. Pt reports that she still has a cough as well. Pt doesn't report any medications PTA. Denies tinnitus, hearing loss, blurred vision , numbness or weakness.    Past Medical History  Diagnosis Date  . Hypertension   . Diabetes mellitus without complication ([place])   . Hemorrhoids    Past Surgical History  Procedure Laterality Date  . Appendectomy     No family history on file. Social History  Substance Use Topics  . Smoking status: Current Every Day Smoker -- 0.50 packs/day    Types: Cigarettes  . Smokeless tobacco: None  . Alcohol Use: No   OB History    No data available     Review of Systems  Constitutional: Negative for fever.  HENT: Positive for ear pain. Negative for hearing loss and tinnitus.   Eyes: Negative for visual disturbance.  Respiratory: Positive for cough.    Neurological: Positive for dizziness. Negative for weakness and numbness.  All other systems reviewed and are negative.     Allergies  Review of patient's allergies indicates no known allergies.  Home Medications   Prior to Admission medications   Medication Sig Start Date End Date Taking? Authorizing Provider  diphenhydrAMINE (BENADRYL) 25 MG tablet Take 1 tablet (25 mg total) by mouth every 6 (six) hours. Patient taking differently: Take 25 mg by mouth every 6 (six) hours as needed for itching.  09/01/14   Britt Bottom, NP  docusate sodium (COLACE) 100 MG capsule Take 1 capsule (100 mg total) by mouth every 12 (twelve) hours. Patient taking differently: Take 100 mg by mouth daily as needed for mild constipation (constipation).  11/21/13   Evelina Bucy, MD  metFORMIN (GLUCOPHAGE) 500 MG tablet Take 500 mg by mouth at bedtime.    Historical Provider, MD  predniSONE (DELTASONE) 20 MG tablet Take 2 tablets (40 mg total) by mouth daily. 09/01/14   Britt Bottom, NP   BP 111/66 mmHg  Pulse 81  Temp(Src) 97.9 F (36.6 C) (Oral)  Resp 16  SpO2 99%   Physical Exam  Constitutional: She is oriented to person, place, and time. She appears well-developed and well-nourished. No distress.  HENT:  Head: Normocephalic and atraumatic.  Left Ear: External ear normal.  Nose: Nose normal.  Mouth/Throat: Oropharynx is clear and moist.  L ear NL R external ear with no  edema or erythema. No tragal tenderness. +mastoid tenderness. No mastoid erythema or edema. TM with serous effusion but no erythema or bulging. No canal abnormalities.  No dizziness with head movement or EOM   Eyes: Conjunctivae and EOM are normal. Pupils are equal, round, and reactive to light.  No nystagmus  Neck: Normal range of motion. Neck supple. No tracheal deviation present.  Cardiovascular: Normal rate, regular rhythm and normal heart sounds.   Pulmonary/Chest: Effort normal and breath sounds normal. No respiratory  distress. She has no wheezes. She has no rales.  Musculoskeletal: Normal range of motion.  Neurological: She is alert and oriented to person, place, and time.  Normal finger to nose No pronator drift No cranial nerve deficit No nystagmus 5/5 strength in bilateral UE and LE Slightly unsteady gait (pt states the room is spinning)  Skin: Skin is warm and dry.  Psychiatric: She has a normal mood and affect. Her behavior is normal.  Nursing note and vitals reviewed.   ED Course  Procedures (including critical care time) DIAGNOSTIC STUDIES: Oxygen Saturation is 99% on RA, normal by my interpretation.    COORDINATION OF CARE: 5:16 PM-Discussed treatment plan with pt at bedside and pt agreed to plan.     Labs Review Labs Reviewed  CBC - Abnormal; Notable for the following:    Hemoglobin 11.4 (*)    HCT 34.4 (*)    All other components within normal limits  BASIC METABOLIC PANEL - Abnormal; Notable for the following:    Glucose, Bld 105 (*)    All other components within normal limits    Imaging Review Ct Head Wo Contrast  09/17/2015  CLINICAL DATA:  Right-sided otalgia.  Mastoiditis.  Dizziness. EXAM: CT TEMPORAL BONES WITH CONTRAST: CT head without contrast. TECHNIQUE: Axial and coronal plane CT imaging of the petrous temporal bones was performed with thin-collimation image reconstruction after intravenous contrast administration. Multiplanar CT image reconstructions were also generated. CT of the head was also obtained without IV contrast. CONTRAST:  21mL ISOVUE-300 IOPAMIDOL (ISOVUE-300) INJECTION 61% COMPARISON:  MRI of the brain 10/29/2005. FINDINGS: CT head: No acute infarct, hemorrhage, or mass lesion is present. The ventricles are of normal size. No significant extraaxial fluid collection is present. A fluid level is present in the right sphenoid sinus. There is scattered opacification of ethmoid air cells bilaterally, left greater than right. The visualized maxillary sinuses are  clear. The left frontal sinus is opacified. The right frontal sinus is not aerated. The calvarium is intact. Atherosclerotic calcifications are present within the cavernous internal carotid arteries bilaterally. CT temporal bones with contrast: Postcontrast at images the brain are unremarkable. Sinus disease is as above. Right external auditory canal is clear. A right middle ear effusion is present. The middle ear ossicles are normally formed and articulating. Fluid extends into the epitympanum. There is fluid in the right mastoid air cells. No osseous destruction is present. The inner ear structures are normally formed. The superior semicircular canal is covered. There is no evidence for subperiosteal abscess or coalescence of air cells. The internal auditory canal and vestibular aqueduct are normal. The left external auditory canal is clear. The middle ear ossicles are normally formed. The inner ear structures are within normal limits. The superior semicircular canal is covered. The mastoid air cells are clear. The internal auditory canal and vestibular aqueduct are normal. IMPRESSION: 1. Normal CT appearance of the brain. 2. Diffuse sinus disease with a large fluid level in the right sphenoid sinus compatible with  acute sinusitis. 3. Extensive anterior ethmoid air cell disease and left frontal sinus opacification. 4. Atherosclerosis. 5. Extensive right middle ear and mastoid effusion without evidence for osseous destruction or subperiosteal abscess. 6. The middle ear ossicles are intact. Electronically Signed   By: San Morelle M.D.   On: 09/17/2015 21:02   Ct Temporal Bones W/cm  09/17/2015  CLINICAL DATA:  Right-sided otalgia.  Mastoiditis.  Dizziness. EXAM: CT TEMPORAL BONES WITH CONTRAST: CT head without contrast. TECHNIQUE: Axial and coronal plane CT imaging of the petrous temporal bones was performed with thin-collimation image reconstruction after intravenous contrast administration. Multiplanar  CT image reconstructions were also generated. CT of the head was also obtained without IV contrast. CONTRAST:  3mL ISOVUE-300 IOPAMIDOL (ISOVUE-300) INJECTION 61% COMPARISON:  MRI of the brain 10/29/2005. FINDINGS: CT head: No acute infarct, hemorrhage, or mass lesion is present. The ventricles are of normal size. No significant extraaxial fluid collection is present. A fluid level is present in the right sphenoid sinus. There is scattered opacification of ethmoid air cells bilaterally, left greater than right. The visualized maxillary sinuses are clear. The left frontal sinus is opacified. The right frontal sinus is not aerated. The calvarium is intact. Atherosclerotic calcifications are present within the cavernous internal carotid arteries bilaterally. CT temporal bones with contrast: Postcontrast at images the brain are unremarkable. Sinus disease is as above. Right external auditory canal is clear. A right middle ear effusion is present. The middle ear ossicles are normally formed and articulating. Fluid extends into the epitympanum. There is fluid in the right mastoid air cells. No osseous destruction is present. The inner ear structures are normally formed. The superior semicircular canal is covered. There is no evidence for subperiosteal abscess or coalescence of air cells. The internal auditory canal and vestibular aqueduct are normal. The left external auditory canal is clear. The middle ear ossicles are normally formed. The inner ear structures are within normal limits. The superior semicircular canal is covered. The mastoid air cells are clear. The internal auditory canal and vestibular aqueduct are normal. IMPRESSION: 1. Normal CT appearance of the brain. 2. Diffuse sinus disease with a large fluid level in the right sphenoid sinus compatible with acute sinusitis. 3. Extensive anterior ethmoid air cell disease and left frontal sinus opacification. 4. Atherosclerosis. 5. Extensive right middle ear and  mastoid effusion without evidence for osseous destruction or subperiosteal abscess. 6. The middle ear ossicles are intact. Electronically Signed   By: San Morelle M.D.   On: 09/17/2015 21:02      EKG Interpretation None      MDM   Final diagnoses:  Acute maxillary sinusitis, recurrence not specified  Middle ear effusion, right    Given mastoid tenderness will CT to r/o mastoiditis, though she has no overlying erythema or edema. Pt with inner ear effusion that is causing vertiginous dizziness. Doubt central neuro etiology as neuro exam otherwise intact. Will trial meclizine. CT head ordered as well.  Per radiology, will need contrast for CT of temporal bones. Will add basic labs at well.  Labs at baseline. Pt states meclizine provided minimal improvement. She declines additional meds for now.  CT reveals diffuse sinus dz with acute sinusitis, extensive ethmoid dz with left sinus opacification, and right middle ear/mastoid effusion w/o evidence of osseous destruction or abscess. Will give course of augmentin. Rx given for decongestant and flonase. Instructed PCP f/u. Pt ambulatory with steady gait. Feels ready to go home. Strict ER return precautions given.   I  personally performed the services described in this documentation, which was scribed in my presence. The recorded information has been reviewed and is accurate.     Anne Ng, PA-C 09/17/15 2138  Harvel Quale, MD 09/25/15 [phone]

## 2015-09-17 NOTE — ED Notes (Signed)
Pt BIB EMS. Pt is from home. She has had cold symptoms for 2 weeks. Now she states she has ear pain to her R ear. Denies drainage. States she also has some vertigo. She is alert, no acute distress. Skin warm, dry.

## 2015-10-20 DIAGNOSIS — H2513 Age-related nuclear cataract, bilateral: Secondary | ICD-10-CM | POA: Diagnosis not present

## 2015-10-20 DIAGNOSIS — E119 Type 2 diabetes mellitus without complications: Secondary | ICD-10-CM | POA: Diagnosis not present

## 2015-10-20 DIAGNOSIS — Z0100 Encounter for examination of eyes and vision without abnormal findings: Secondary | ICD-10-CM | POA: Diagnosis not present

## 2015-10-27 DIAGNOSIS — I10 Essential (primary) hypertension: Secondary | ICD-10-CM | POA: Diagnosis not present

## 2015-10-27 DIAGNOSIS — E782 Mixed hyperlipidemia: Secondary | ICD-10-CM | POA: Diagnosis not present

## 2015-10-27 DIAGNOSIS — J069 Acute upper respiratory infection, unspecified: Secondary | ICD-10-CM | POA: Diagnosis not present

## 2015-12-08 ENCOUNTER — Other Ambulatory Visit: Payer: Self-pay | Admitting: Family Medicine

## 2015-12-08 ENCOUNTER — Other Ambulatory Visit (HOSPITAL_COMMUNITY)
Admission: RE | Admit: 2015-12-08 | Discharge: 2015-12-08 | Disposition: A | Discharge status: Home / Self Care | Payer: Medicare Other | Source: Ambulatory Visit | Attending: Family Medicine | Admitting: Family Medicine

## 2015-12-08 DIAGNOSIS — Z008 Encounter for other general examination: Secondary | ICD-10-CM | POA: Diagnosis not present

## 2015-12-08 DIAGNOSIS — Z01419 Encounter for gynecological examination (general) (routine) without abnormal findings: Secondary | ICD-10-CM | POA: Insufficient documentation

## 2015-12-08 DIAGNOSIS — I10 Essential (primary) hypertension: Secondary | ICD-10-CM | POA: Diagnosis not present

## 2015-12-08 DIAGNOSIS — E119 Type 2 diabetes mellitus without complications: Secondary | ICD-10-CM | POA: Diagnosis not present

## 2015-12-08 DIAGNOSIS — Z113 Encounter for screening for infections with a predominantly sexual mode of transmission: Secondary | ICD-10-CM | POA: Diagnosis not present

## 2015-12-08 DIAGNOSIS — N760 Acute vaginitis: Secondary | ICD-10-CM | POA: Insufficient documentation

## 2015-12-08 DIAGNOSIS — E7800 Pure hypercholesterolemia, unspecified: Secondary | ICD-10-CM | POA: Diagnosis not present

## 2015-12-08 DIAGNOSIS — Z0000 Encounter for general adult medical examination without abnormal findings: Secondary | ICD-10-CM | POA: Diagnosis not present

## 2015-12-08 DIAGNOSIS — Z1151 Encounter for screening for human papillomavirus (HPV): Secondary | ICD-10-CM | POA: Diagnosis not present

## 2015-12-09 LAB — CYTOLOGY - PAP

## 2016-04-06 DIAGNOSIS — I10 Essential (primary) hypertension: Secondary | ICD-10-CM | POA: Diagnosis not present

## 2016-04-06 DIAGNOSIS — E1122 Type 2 diabetes mellitus with diabetic chronic kidney disease: Secondary | ICD-10-CM | POA: Diagnosis not present

## 2016-07-16 ENCOUNTER — Other Ambulatory Visit: Payer: Self-pay | Admitting: Family Medicine

## 2016-07-16 DIAGNOSIS — E785 Hyperlipidemia, unspecified: Secondary | ICD-10-CM | POA: Diagnosis not present

## 2016-07-16 DIAGNOSIS — E7800 Pure hypercholesterolemia, unspecified: Secondary | ICD-10-CM | POA: Diagnosis not present

## 2016-07-16 DIAGNOSIS — I10 Essential (primary) hypertension: Secondary | ICD-10-CM | POA: Diagnosis not present

## 2016-07-16 DIAGNOSIS — R7309 Other abnormal glucose: Secondary | ICD-10-CM | POA: Diagnosis not present

## 2016-07-16 DIAGNOSIS — Z1231 Encounter for screening mammogram for malignant neoplasm of breast: Secondary | ICD-10-CM

## 2016-07-16 DIAGNOSIS — L853 Xerosis cutis: Secondary | ICD-10-CM | POA: Diagnosis not present

## 2016-08-27 ENCOUNTER — Ambulatory Visit
Admission: RE | Admit: 2016-08-27 | Discharge: 2016-08-27 | Disposition: A | Discharge status: Home / Self Care | Payer: Medicare Other | Source: Ambulatory Visit | Attending: Family Medicine | Admitting: Family Medicine

## 2016-08-27 DIAGNOSIS — Z1231 Encounter for screening mammogram for malignant neoplasm of breast: Secondary | ICD-10-CM

## 2016-11-15 DIAGNOSIS — E785 Hyperlipidemia, unspecified: Secondary | ICD-10-CM | POA: Diagnosis not present

## 2016-11-15 DIAGNOSIS — E1122 Type 2 diabetes mellitus with diabetic chronic kidney disease: Secondary | ICD-10-CM | POA: Diagnosis not present

## 2016-11-15 DIAGNOSIS — I10 Essential (primary) hypertension: Secondary | ICD-10-CM | POA: Diagnosis not present

## 2016-11-15 DIAGNOSIS — R21 Rash and other nonspecific skin eruption: Secondary | ICD-10-CM | POA: Diagnosis not present

## 2016-12-18 ENCOUNTER — Other Ambulatory Visit: Payer: Self-pay | Admitting: Family Medicine

## 2016-12-18 ENCOUNTER — Other Ambulatory Visit (HOSPITAL_COMMUNITY)
Admission: RE | Admit: 2016-12-18 | Discharge: 2016-12-18 | Disposition: A | Discharge status: Home / Self Care | Payer: Medicare Other | Source: Ambulatory Visit | Attending: Family Medicine | Admitting: Family Medicine

## 2016-12-18 DIAGNOSIS — Z124 Encounter for screening for malignant neoplasm of cervix: Secondary | ICD-10-CM | POA: Diagnosis not present

## 2016-12-18 DIAGNOSIS — Z0000 Encounter for general adult medical examination without abnormal findings: Secondary | ICD-10-CM | POA: Diagnosis not present

## 2016-12-21 LAB — CYTOLOGY - PAP
BACTERIAL VAGINITIS: POSITIVE — AB
CANDIDA VAGINITIS: NEGATIVE
Chlamydia: NEGATIVE
Diagnosis: NEGATIVE
HPV: NOT DETECTED
Herpes: NEGATIVE
NEISSERIA GONORRHEA: NEGATIVE
Trichomonas: NEGATIVE

## 2017-02-19 DIAGNOSIS — I10 Essential (primary) hypertension: Secondary | ICD-10-CM | POA: Diagnosis not present

## 2017-02-19 DIAGNOSIS — E785 Hyperlipidemia, unspecified: Secondary | ICD-10-CM | POA: Diagnosis not present

## 2017-02-19 DIAGNOSIS — E119 Type 2 diabetes mellitus without complications: Secondary | ICD-10-CM | POA: Diagnosis not present

## 2017-03-01 DIAGNOSIS — L0390 Cellulitis, unspecified: Secondary | ICD-10-CM | POA: Diagnosis not present

## 2017-04-03 DIAGNOSIS — L0390 Cellulitis, unspecified: Secondary | ICD-10-CM | POA: Diagnosis not present

## 2017-04-15 ENCOUNTER — Other Ambulatory Visit: Payer: Self-pay | Admitting: Pharmacist

## 2017-04-15 NOTE — Patient Outreach (Signed)
Incoming call from Sharyl Nimrod in response to the [facility] Medication Adherence Campaign. Patient hangs up.  Harlow Asa, PharmD, [place] [facility] [phone]

## 2017-08-15 DIAGNOSIS — Z1211 Encounter for screening for malignant neoplasm of colon: Secondary | ICD-10-CM | POA: Diagnosis not present

## 2017-08-15 DIAGNOSIS — K5904 Chronic idiopathic constipation: Secondary | ICD-10-CM | POA: Diagnosis not present

## 2017-08-22 ENCOUNTER — Other Ambulatory Visit: Payer: Self-pay | Admitting: Family Medicine

## 2017-08-22 DIAGNOSIS — Z1231 Encounter for screening mammogram for malignant neoplasm of breast: Secondary | ICD-10-CM

## 2017-09-03 DIAGNOSIS — M130 Polyarthritis, unspecified: Secondary | ICD-10-CM | POA: Diagnosis not present

## 2017-09-03 DIAGNOSIS — E782 Mixed hyperlipidemia: Secondary | ICD-10-CM | POA: Diagnosis not present

## 2017-09-03 DIAGNOSIS — R7309 Other abnormal glucose: Secondary | ICD-10-CM | POA: Diagnosis not present

## 2017-09-03 DIAGNOSIS — I10 Essential (primary) hypertension: Secondary | ICD-10-CM | POA: Diagnosis not present

## 2017-09-11 DIAGNOSIS — K635 Polyp of colon: Secondary | ICD-10-CM | POA: Diagnosis not present

## 2017-09-11 DIAGNOSIS — D128 Benign neoplasm of rectum: Secondary | ICD-10-CM | POA: Diagnosis not present

## 2017-09-11 DIAGNOSIS — K621 Rectal polyp: Secondary | ICD-10-CM | POA: Diagnosis not present

## 2017-09-11 DIAGNOSIS — D125 Benign neoplasm of sigmoid colon: Secondary | ICD-10-CM | POA: Diagnosis not present

## 2017-09-11 DIAGNOSIS — Z1211 Encounter for screening for malignant neoplasm of colon: Secondary | ICD-10-CM | POA: Diagnosis not present

## 2017-09-12 ENCOUNTER — Ambulatory Visit: Payer: Medicare Other

## 2017-10-17 DIAGNOSIS — Z0000 Encounter for general adult medical examination without abnormal findings: Secondary | ICD-10-CM | POA: Diagnosis not present

## 2017-12-06 ENCOUNTER — Other Ambulatory Visit: Payer: Self-pay

## 2017-12-06 NOTE — Patient Outreach (Signed)
[place] [facility]) Care Management  12/06/2017  Colleen [name] [birth year] [id number]   Medication Adherence call to Colleen [name] patient is showing past due on Rosuvastatin 10 mg spoke with patient she needs a refill on this medication but wants a ninety days supply call [facility] they need a new prescription for ninety days supply, call doctor Criss Rosales left a message for them to call in a new prescription for a ninety days supply. Colleen [name] is showing past due under [place].  [place] [facility] Direct Dial [phone]  Fax [phone] [email or web address]

## 2017-12-19 DIAGNOSIS — Z0000 Encounter for general adult medical examination without abnormal findings: Secondary | ICD-10-CM | POA: Diagnosis not present

## 2017-12-19 DIAGNOSIS — E782 Mixed hyperlipidemia: Secondary | ICD-10-CM | POA: Diagnosis not present

## 2017-12-19 DIAGNOSIS — E119 Type 2 diabetes mellitus without complications: Secondary | ICD-10-CM | POA: Diagnosis not present

## 2017-12-19 DIAGNOSIS — R21 Rash and other nonspecific skin eruption: Secondary | ICD-10-CM | POA: Diagnosis not present

## 2018-01-20 ENCOUNTER — Other Ambulatory Visit: Payer: Self-pay

## 2018-01-20 NOTE — Patient Outreach (Signed)
[place] [facility]) Care Management  01/20/2018  Keslie Gritz 1955/10/10 [id number]   Medication Adherence call to Mrs. Sharyl Nimrod patient did not answer patient is due on Telmisartan/Hctz 40/12.5 mg and Rosuvastatin 10 mg under [place].   [place] [facility] Direct Dial [phone]  Fax [phone] [email or web address]

## 2018-10-14 ENCOUNTER — Other Ambulatory Visit: Payer: Self-pay | Admitting: Family Medicine

## 2018-10-14 DIAGNOSIS — Z1231 Encounter for screening mammogram for malignant neoplasm of breast: Secondary | ICD-10-CM

## 2018-11-05 ENCOUNTER — Other Ambulatory Visit: Payer: Self-pay

## 2018-11-05 NOTE — Patient Outreach (Signed)
[place] [facility]) Care Management  11/05/2018  Shaley Leavens 1956-01-18 [id number]   Medication Adherence call to Mrs. Sharyl Nimrod [facility] Ident[employer] Verify spoke with patient she is past due on Rosuvastatin 10 mg patient explain she has schedule an appointment and will ask doctor to fill her medication patient explain doctor wont fill her medications until she is seen again. Mrs. Nachreiner is showing past due under [place].  [place] [facility] Direct Dial [phone]  Fax [phone] [email or web address]

## 2018-11-27 ENCOUNTER — Ambulatory Visit
Admission: RE | Admit: 2018-11-27 | Discharge: 2018-11-27 | Disposition: A | Discharge status: Home / Self Care | Payer: Medicare Other | Source: Ambulatory Visit | Attending: Family Medicine | Admitting: Family Medicine

## 2018-11-27 ENCOUNTER — Other Ambulatory Visit: Payer: Self-pay

## 2018-11-27 DIAGNOSIS — Z1231 Encounter for screening mammogram for malignant neoplasm of breast: Secondary | ICD-10-CM | POA: Diagnosis not present

## 2018-12-08 ENCOUNTER — Other Ambulatory Visit: Payer: Self-pay

## 2018-12-08 NOTE — Patient Outreach (Signed)
[place] [facility]) Care Management  12/08/2018  Shavell Nored 08/24/1955 [id number]   Medication Adherence call to Colleen [name] [employer] Identifiers Verify spoke with patient she is past due on Rosuvastatin 10 mg patient explain she has a few left patient ask if we can call doctor office for a refill request,doctors office said patient needs to keep her appointment or schedule for an earlier date and they can call her prescription in they said last time patient did not keep her appointment spoke with patient she will keep her appointment for August 26 and will go with out medication until then.Mrs. Doristine Bosworth is showing past due under [place].   [place] [facility] Direct Dial [phone]  Fax [phone] [email or web address]

## 2018-12-24 DIAGNOSIS — I10 Essential (primary) hypertension: Secondary | ICD-10-CM | POA: Diagnosis not present

## 2018-12-24 DIAGNOSIS — Z0001 Encounter for general adult medical examination with abnormal findings: Secondary | ICD-10-CM | POA: Diagnosis not present

## 2018-12-24 DIAGNOSIS — M130 Polyarthritis, unspecified: Secondary | ICD-10-CM | POA: Diagnosis not present

## 2018-12-24 DIAGNOSIS — E782 Mixed hyperlipidemia: Secondary | ICD-10-CM | POA: Diagnosis not present

## 2018-12-24 DIAGNOSIS — E1169 Type 2 diabetes mellitus with other specified complication: Secondary | ICD-10-CM | POA: Diagnosis not present

## 2019-07-03 DIAGNOSIS — Z7984 Long term (current) use of oral hypoglycemic drugs: Secondary | ICD-10-CM | POA: Diagnosis not present

## 2019-07-03 DIAGNOSIS — I10 Essential (primary) hypertension: Secondary | ICD-10-CM | POA: Diagnosis not present

## 2019-07-03 DIAGNOSIS — E785 Hyperlipidemia, unspecified: Secondary | ICD-10-CM | POA: Diagnosis not present

## 2019-07-03 DIAGNOSIS — E1165 Type 2 diabetes mellitus with hyperglycemia: Secondary | ICD-10-CM | POA: Diagnosis not present

## 2019-07-20 DIAGNOSIS — I10 Essential (primary) hypertension: Secondary | ICD-10-CM | POA: Diagnosis not present

## 2019-07-20 DIAGNOSIS — E1169 Type 2 diabetes mellitus with other specified complication: Secondary | ICD-10-CM | POA: Diagnosis not present

## 2019-07-20 DIAGNOSIS — M130 Polyarthritis, unspecified: Secondary | ICD-10-CM | POA: Diagnosis not present

## 2019-07-20 DIAGNOSIS — E782 Mixed hyperlipidemia: Secondary | ICD-10-CM | POA: Diagnosis not present

## 2019-07-20 DIAGNOSIS — R2681 Unsteadiness on feet: Secondary | ICD-10-CM | POA: Diagnosis not present

## 2019-10-19 ENCOUNTER — Other Ambulatory Visit: Payer: Self-pay | Admitting: Family Medicine

## 2019-10-19 DIAGNOSIS — Z1231 Encounter for screening mammogram for malignant neoplasm of breast: Secondary | ICD-10-CM

## 2019-10-20 DIAGNOSIS — Z0000 Encounter for general adult medical examination without abnormal findings: Secondary | ICD-10-CM | POA: Diagnosis not present

## 2019-10-20 DIAGNOSIS — E1169 Type 2 diabetes mellitus with other specified complication: Secondary | ICD-10-CM | POA: Diagnosis not present

## 2019-10-20 DIAGNOSIS — E782 Mixed hyperlipidemia: Secondary | ICD-10-CM | POA: Diagnosis not present

## 2019-10-20 DIAGNOSIS — I10 Essential (primary) hypertension: Secondary | ICD-10-CM | POA: Diagnosis not present

## 2019-10-29 ENCOUNTER — Ambulatory Visit (INDEPENDENT_AMBULATORY_CARE_PROVIDER_SITE_OTHER): Payer: Medicare Other

## 2019-10-29 ENCOUNTER — Ambulatory Visit (INDEPENDENT_AMBULATORY_CARE_PROVIDER_SITE_OTHER): Payer: Medicare Other | Admitting: Podiatry

## 2019-10-29 ENCOUNTER — Encounter: Payer: Self-pay | Admitting: Podiatry

## 2019-10-29 ENCOUNTER — Other Ambulatory Visit: Payer: Self-pay

## 2019-10-29 DIAGNOSIS — M722 Plantar fascial fibromatosis: Secondary | ICD-10-CM

## 2019-10-29 DIAGNOSIS — M19072 Primary osteoarthritis, left ankle and foot: Secondary | ICD-10-CM | POA: Diagnosis not present

## 2019-10-29 DIAGNOSIS — M79671 Pain in right foot: Secondary | ICD-10-CM

## 2019-10-29 DIAGNOSIS — M79672 Pain in left foot: Secondary | ICD-10-CM

## 2019-10-29 MED ORDER — MELOXICAM 15 MG PO TABS
15.0000 mg | ORAL_TABLET | Freq: Every day | ORAL | 0 refills | Status: DC
Start: 2019-10-29 — End: 2019-11-25

## 2019-10-29 NOTE — Patient Instructions (Signed)

## 2019-10-30 NOTE — Progress Notes (Signed)
  Subjective:  Patient ID: Colleen [name], female    DOB: 15-Nov-1955,  MRN: [id number]  Chief Complaint  Patient presents with  . Foot Pain    bilateral heel pain    64 y.o. female presents with the above complaint. History confirmed with patient. She complains of right heel pain present for several years, has been worsening recently.  Tylenol arthritis strength is helpful for her.  Pain is worse in the morning when she gets up and gets out of bed.  Or has been seated for a long time.  She also notes aching deep pain across the top of her left foot.  Objective:  Physical Exam: warm, good capillary refill, no trophic changes or ulcerative lesions, normal DP and PT pulses and normal sensory exam. Left Foot: Mild tenderness and palpable bony prominence over the midfoot  Right Foot: point tenderness over the heel pad   No images are attached to the encounter.  Radiographs: X-ray of both feet: degenerative changes of the Left third tarsometatarsal joint, naviculocuneiform joint joint and plantar calcaneal spur present bilaterally Assessment:   1. Plantar fasciitis of right foot   2. 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  Arthritis and Plantar Fasciitis -XR reviewed with patient -Discussed etiology, progression and treatment of midfoot osteoarthritis.  She is only mildly tender today.  She will begin using Voltaren gel for as needed relief.  Meloxicam will also help with this. --XR reviewed with patient -Educated patient on stretching and icing of the affected limb -Plantar fascial brace dispensed -Injection delivered to the plantar fascia of the right foot, 0.5 cc Marcaine plain, 0.5 cc Xylocaine plain, 0.5 cc dexamethasone phosphate 4 mg/mL, 0.5 cc Kenalog 10.. -Rx for meloxicam. Educated on use, risks and benefits of the medication -Stretching exercises reviewed she will start these on  Monday.  Return in about 4 weeks (around 11/26/2019).

## 2019-11-12 DIAGNOSIS — H8309 Labyrinthitis, unspecified ear: Secondary | ICD-10-CM | POA: Diagnosis not present

## 2019-11-12 DIAGNOSIS — I10 Essential (primary) hypertension: Secondary | ICD-10-CM | POA: Diagnosis not present

## 2019-11-12 DIAGNOSIS — E1169 Type 2 diabetes mellitus with other specified complication: Secondary | ICD-10-CM | POA: Diagnosis not present

## 2019-11-12 DIAGNOSIS — M7731 Calcaneal spur, right foot: Secondary | ICD-10-CM | POA: Diagnosis not present

## 2019-11-23 ENCOUNTER — Other Ambulatory Visit: Payer: Self-pay | Admitting: Podiatry

## 2019-11-23 DIAGNOSIS — M722 Plantar fascial fibromatosis: Secondary | ICD-10-CM

## 2019-11-25 ENCOUNTER — Ambulatory Visit (INDEPENDENT_AMBULATORY_CARE_PROVIDER_SITE_OTHER): Payer: Medicare Other | Admitting: Podiatry

## 2019-11-25 ENCOUNTER — Other Ambulatory Visit: Payer: Self-pay

## 2019-11-25 DIAGNOSIS — M19072 Primary osteoarthritis, left ankle and foot: Secondary | ICD-10-CM

## 2019-11-25 DIAGNOSIS — M722 Plantar fascial fibromatosis: Secondary | ICD-10-CM | POA: Diagnosis not present

## 2019-11-25 MED ORDER — DICLOFENAC SODIUM 1 % EX GEL: 4.0000 g | Freq: Four times a day (QID) | CUTANEOUS | 2 refills | Status: DC

## 2019-11-25 MED ORDER — MELOXICAM 15 MG PO TABS: 15.0000 mg | ORAL_TABLET | Freq: Every day | ORAL | 0 refills | Status: DC

## 2019-11-25 NOTE — Patient Instructions (Signed)

## 2019-11-26 ENCOUNTER — Encounter: Payer: Self-pay | Admitting: Podiatry

## 2019-11-26 ENCOUNTER — Ambulatory Visit: Payer: Self-pay | Admitting: Podiatry

## 2019-11-26 NOTE — Progress Notes (Signed)
  Subjective:  Patient ID: Colleen [name], female    DOB: 05/24/1955,  MRN: [id number]  Chief Complaint  Patient presents with  . Plantar Fasciitis    Here for 4-week follow-up of right heel pain.  She states she is feeling some improvement after the injection with some slight soreness.  Brace is helping with support.  Overall she is doing better than before    64 y.o. female presents with the above complaint. History confirmed with patient.  She states she does not get the meloxicam, it was sent to the incorrect pharmacy.  She uses a mail order pharmacy called [facility]  Objective:  Physical Exam: warm, good capillary refill, no trophic changes or ulcerative lesions, normal DP and PT pulses and normal sensory exam.   Right Foot: Mild tenderness today on the medial calcaneal tubercle, much improved than previously.  Assessment:   1. Plantar fasciitis of right foot   2. 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   -Meloxicam and Voltaren gel was recent to the pharmacy.  Verified with patient before departing. -Continue using the plantar fascial brace -She should continue her stretching exercises. -We will stay the current treatment course as she has been improving and she is satisfied with this.  If she worsens or plateaus before next visit we will consider for physical therapy.  Return in about 1 month (around 12/26/2019) for recheck plantar fasciitis.

## 2019-11-27 DIAGNOSIS — Z7984 Long term (current) use of oral hypoglycemic drugs: Secondary | ICD-10-CM | POA: Diagnosis not present

## 2019-11-27 DIAGNOSIS — E1165 Type 2 diabetes mellitus with hyperglycemia: Secondary | ICD-10-CM | POA: Diagnosis not present

## 2019-11-27 DIAGNOSIS — E785 Hyperlipidemia, unspecified: Secondary | ICD-10-CM | POA: Diagnosis not present

## 2019-11-27 DIAGNOSIS — I10 Essential (primary) hypertension: Secondary | ICD-10-CM | POA: Diagnosis not present

## 2019-12-03 ENCOUNTER — Ambulatory Visit
Admission: RE | Admit: 2019-12-03 | Discharge: 2019-12-03 | Disposition: A | Discharge status: Home / Self Care | Payer: Medicare Other | Source: Ambulatory Visit | Attending: Family Medicine | Admitting: Family Medicine

## 2019-12-03 ENCOUNTER — Other Ambulatory Visit: Payer: Self-pay

## 2019-12-03 DIAGNOSIS — Z1231 Encounter for screening mammogram for malignant neoplasm of breast: Secondary | ICD-10-CM

## 2019-12-24 ENCOUNTER — Ambulatory Visit (INDEPENDENT_AMBULATORY_CARE_PROVIDER_SITE_OTHER): Payer: Medicare Other | Admitting: Podiatry

## 2019-12-24 ENCOUNTER — Other Ambulatory Visit: Payer: Self-pay

## 2019-12-24 DIAGNOSIS — M722 Plantar fascial fibromatosis: Secondary | ICD-10-CM

## 2019-12-24 MED ORDER — METHYLPREDNISOLONE 4 MG PO TBPK
ORAL_TABLET | ORAL | 0 refills | Status: AC
Start: 2019-12-24 — End: ?

## 2019-12-24 NOTE — Patient Instructions (Signed)

## 2019-12-25 NOTE — Progress Notes (Signed)
  Subjective:  Patient ID: Colleen [name], female    DOB: March 17, 1956,  MRN: [id number]  Chief Complaint  Patient presents with  . Foot Pain     1 month f/u heel spur right    64 y.o. female presents with the above complaint. History confirmed with patient.  Pain today has not improved much.  Objective:  Physical Exam: warm, good capillary refill, no trophic changes or ulcerative lesions, normal DP and PT pulses and normal sensory exam.   Right Foot: Mild tenderness today on the medial calcaneal tubercle, similar to previous exam  Assessment:   1. Plantar fasciitis      Plan:  Patient was evaluated and treated and all questions answered.   -Discussed with her a another injection may be helpful.  She said she would prefer to avoid injection today. -We also discussed physical therapy.  She says she would not like to go just yet.  Would like to continue her own stretching exercises at home.  These were reprinted and given for her -Rx for methylprednisolone Dosepak sent to pharmacy -Continue using the plantar fascial brace -She should continue her stretching exercises.    Return in about 1 month (around 01/24/2020) for recheck plantar fasciitis.

## 2019-12-29 DIAGNOSIS — Z0001 Encounter for general adult medical examination with abnormal findings: Secondary | ICD-10-CM | POA: Diagnosis not present

## 2019-12-29 DIAGNOSIS — I10 Essential (primary) hypertension: Secondary | ICD-10-CM | POA: Diagnosis not present

## 2019-12-29 DIAGNOSIS — E1165 Type 2 diabetes mellitus with hyperglycemia: Secondary | ICD-10-CM | POA: Diagnosis not present

## 2019-12-29 DIAGNOSIS — Z0000 Encounter for general adult medical examination without abnormal findings: Secondary | ICD-10-CM | POA: Diagnosis not present

## 2020-01-06 ENCOUNTER — Other Ambulatory Visit: Payer: Self-pay | Admitting: Podiatry

## 2020-01-06 NOTE — Telephone Encounter (Signed)
PCP requesting refills on:    Diclofenac Meloxicam

## 2020-01-07 ENCOUNTER — Telehealth: Payer: Self-pay | Admitting: Podiatry

## 2020-01-07 NOTE — Telephone Encounter (Signed)
Pt's pharmacy called stating they need an order for the meloxicam (MOBIC) 15 MG, and diclofenac Sodium (VOLTAREN) 1% GEL. Please advise.

## 2020-01-09 ENCOUNTER — Other Ambulatory Visit: Payer: Self-pay | Admitting: Podiatry

## 2020-01-09 NOTE — Telephone Encounter (Signed)
Please advise 

## 2020-01-28 DIAGNOSIS — E1165 Type 2 diabetes mellitus with hyperglycemia: Secondary | ICD-10-CM | POA: Diagnosis not present

## 2020-01-28 DIAGNOSIS — Z7984 Long term (current) use of oral hypoglycemic drugs: Secondary | ICD-10-CM | POA: Diagnosis not present

## 2020-01-28 DIAGNOSIS — I10 Essential (primary) hypertension: Secondary | ICD-10-CM | POA: Diagnosis not present

## 2020-01-28 DIAGNOSIS — E785 Hyperlipidemia, unspecified: Secondary | ICD-10-CM | POA: Diagnosis not present

## 2020-02-22 DIAGNOSIS — E119 Type 2 diabetes mellitus without complications: Secondary | ICD-10-CM | POA: Diagnosis not present

## 2020-02-22 DIAGNOSIS — Z0100 Encounter for examination of eyes and vision without abnormal findings: Secondary | ICD-10-CM | POA: Diagnosis not present

## 2020-04-29 DIAGNOSIS — I10 Essential (primary) hypertension: Secondary | ICD-10-CM | POA: Diagnosis not present

## 2020-04-29 DIAGNOSIS — E785 Hyperlipidemia, unspecified: Secondary | ICD-10-CM | POA: Diagnosis not present

## 2020-04-29 DIAGNOSIS — E1165 Type 2 diabetes mellitus with hyperglycemia: Secondary | ICD-10-CM | POA: Diagnosis not present

## 2020-04-29 DIAGNOSIS — Z7984 Long term (current) use of oral hypoglycemic drugs: Secondary | ICD-10-CM | POA: Diagnosis not present

## 2020-05-30 DIAGNOSIS — I10 Essential (primary) hypertension: Secondary | ICD-10-CM | POA: Diagnosis not present

## 2020-05-30 DIAGNOSIS — E1165 Type 2 diabetes mellitus with hyperglycemia: Secondary | ICD-10-CM | POA: Diagnosis not present

## 2020-05-30 DIAGNOSIS — Z7984 Long term (current) use of oral hypoglycemic drugs: Secondary | ICD-10-CM | POA: Diagnosis not present

## 2020-05-30 DIAGNOSIS — E785 Hyperlipidemia, unspecified: Secondary | ICD-10-CM | POA: Diagnosis not present

## 2020-06-21 DIAGNOSIS — M130 Polyarthritis, unspecified: Secondary | ICD-10-CM | POA: Diagnosis not present

## 2020-06-21 DIAGNOSIS — E782 Mixed hyperlipidemia: Secondary | ICD-10-CM | POA: Diagnosis not present

## 2020-06-21 DIAGNOSIS — E7800 Pure hypercholesterolemia, unspecified: Secondary | ICD-10-CM | POA: Diagnosis not present

## 2020-06-21 DIAGNOSIS — E1169 Type 2 diabetes mellitus with other specified complication: Secondary | ICD-10-CM | POA: Diagnosis not present

## 2020-06-21 DIAGNOSIS — I10 Essential (primary) hypertension: Secondary | ICD-10-CM | POA: Diagnosis not present

## 2020-06-23 ENCOUNTER — Ambulatory Visit: Payer: Medicare Other | Admitting: Internal Medicine

## 2020-06-27 DIAGNOSIS — E785 Hyperlipidemia, unspecified: Secondary | ICD-10-CM | POA: Diagnosis not present

## 2020-06-27 DIAGNOSIS — I10 Essential (primary) hypertension: Secondary | ICD-10-CM | POA: Diagnosis not present

## 2020-06-27 DIAGNOSIS — E1165 Type 2 diabetes mellitus with hyperglycemia: Secondary | ICD-10-CM | POA: Diagnosis not present

## 2020-06-27 DIAGNOSIS — Z7984 Long term (current) use of oral hypoglycemic drugs: Secondary | ICD-10-CM | POA: Diagnosis not present

## 2020-07-22 ENCOUNTER — Encounter (HOSPITAL_COMMUNITY): Payer: Self-pay

## 2020-07-22 ENCOUNTER — Other Ambulatory Visit: Payer: Self-pay

## 2020-07-22 ENCOUNTER — Ambulatory Visit (HOSPITAL_COMMUNITY)
Admission: EM | Admit: 2020-07-22 | Discharge: 2020-07-22 | Disposition: A | Discharge status: Home / Self Care | Payer: Medicare Other | Attending: Family Medicine | Admitting: Family Medicine

## 2020-07-22 DIAGNOSIS — S46911A Strain of unspecified muscle, fascia and tendon at shoulder and upper arm level, right arm, initial encounter: Secondary | ICD-10-CM

## 2020-07-22 MED ORDER — NAPROXEN 500 MG PO TABS: 500.0000 mg | ORAL_TABLET | Freq: Two times a day (BID) | ORAL | 0 refills | Status: AC | PRN

## 2020-07-22 MED ORDER — CYCLOBENZAPRINE HCL 5 MG PO TABS: 5.0000 mg | ORAL_TABLET | Freq: Three times a day (TID) | ORAL | 0 refills | Status: AC | PRN

## 2020-07-22 NOTE — ED Triage Notes (Signed)
Pt presents with neck pain and left shoulder pain X 3 weeks. Pt states she fell. Pt states she feels a tingling sensation.

## 2020-07-22 NOTE — ED Provider Notes (Signed)
[place]    CSN: [id number] Arrival date & time: 07/22/20  1548      History   Chief Complaint Chief Complaint  Patient presents with  . Neck Pain  . Shoulder Pain    HPI Colleen [name] is a 65 y.o. female.   Patient presenting today with almost 2-week history of right lateral shoulder pain that extends toward the neck and inward toward the chest.  She states this started with a fall where most of her weight on the shoulder during impact.  She had good range of motion and strength, no numbness tingling or swelling to the arm.  Has not been trying anything other than over-the-counter pain creams and states she stopped this because she did not like the tingling sensation that caused.  Denies known history of shoulder issues on this side.     Past Medical History:  Diagnosis Date  . Diabetes mellitus without complication ([place])   . Hemorrhoids   . Hypertension     There are no problems to display for this patient.   Past Surgical History:  Procedure Laterality Date  . APPENDECTOMY      OB History   No obstetric history on file.      Home Medications    Prior to Admission medications   Medication Sig Start Date End Date Taking? Authorizing Provider  cyclobenzaprine (FLEXERIL) 5 MG tablet Take 1 tablet (5 mg total) by mouth 3 (three) times daily as needed for muscle spasms. 07/22/20  Yes Volney American, PA-C  naproxen (NAPROSYN) 500 MG tablet Take 1 tablet (500 mg total) by mouth 2 (two) times daily as needed. 07/22/20  Yes Volney American, PA-C  amoxicillin-clavulanate (AUGMENTIN) 875-125 MG tablet Take 1 tablet by mouth every 12 (twelve) hours. 09/17/15   Sam, Olivia Canter, PA-C  dextromethorphan 15 MG/5ML syrup Take 10 mLs by mouth 4 (four) times daily as needed for cough.    [provider]  diclofenac Sodium (VOLTAREN) 1 % GEL apply 4 grams TOPICALLY FOUR TIMES DAILY 01/09/20   McDonald, Stephan Minister, DPM  diphenhydrAMINE (BENADRYL) 25  MG tablet Take 1 tablet (25 mg total) by mouth every 6 (six) hours. Patient not taking: Reported on 09/17/2015 09/01/14   Britt Bottom, NP  docusate sodium (COLACE) 100 MG capsule Take 1 capsule (100 mg total) by mouth every 12 (twelve) hours. Patient not taking: Reported on 09/17/2015 11/21/13   Evelina Bucy, MD  fluticasone [facility]) 50 MCG/ACT nasal spray Place 2 sprays into both nostrils daily. 09/17/15   Sam, Olivia Canter, PA-C  hydrochlorothiazide (HYDRODIURIL) 12.5 MG tablet Take 12.5 mg by mouth daily. 10/09/19   [provider]  meclizine (ANTIVERT) 12.5 MG tablet Take 12.5 mg by mouth 3 (three) times daily. 10/21/19   [provider]  metFORMIN (GLUCOPHAGE) 500 MG tablet Take 500 mg by mouth at bedtime.    [provider]  methylPREDNISolone (MEDROL DOSEPAK) 4 MG TBPK tablet 6 day dose pack - take as directed 12/24/19   Criselda Peaches, DPM  pseudoephedrine (SUDAFED) 60 MG tablet Take 1 tablet (60 mg total) by mouth every 4 (four) hours as needed for congestion. 09/17/15   Sam, Olivia Canter, PA-C  rosuvastatin (CRESTOR) 10 MG tablet Take 10 mg by mouth at bedtime. 10/09/19   [provider]  telmisartan (MICARDIS) 40 MG tablet Take 40 mg by mouth daily. 10/09/19   [provider]  telmisartan-hydrochlorothiazide (MICARDIS HCT) 40-12.5 MG tablet Take 1 tablet by mouth  daily. 09/01/15   [provider]    Family History Family History  Problem Relation Age of Onset  . Breast cancer Sister 47    Social History Social History   Tobacco Use  . Smoking status: Current Every Day Smoker    Packs/day: 0.50    Types: Cigarettes  . Smokeless tobacco: Never Used  Substance Use Topics  . Alcohol use: No     Allergies   Patient has no known allergies.   Review of Systems Review of Systems Per HPI Physical Exam Triage Vital Signs ED Triage Vitals  Enc Vitals Group     BP 07/22/20 1640 (!) 153/66     Pulse Rate 07/22/20 1639 69      Resp 07/22/20 1639 18     Temp 07/22/20 1639 97.6 F (36.4 C)     Temp Source 07/22/20 1639 Oral     SpO2 07/22/20 1639 98 %     Weight --      Height --      Head Circumference --      Peak Flow --      Pain Score 07/22/20 1637 10     Pain Loc --      Pain Edu? --      Excl. in [place]? --    No data found.  Updated Vital Signs BP (!) 153/66 (BP Location: Left Arm)   Pulse 69   Temp 97.6 F (36.4 C) (Oral)   Resp 18   SpO2 98%   Visual Acuity Right Eye Distance:   Left Eye Distance:   Bilateral Distance:    Right Eye Near:   Left Eye Near:    Bilateral Near:     Physical Exam Vitals and nursing note reviewed.  Constitutional:      Appearance: Normal appearance. She is not ill-appearing.  HENT:     Head: Atraumatic.  Eyes:     Extraocular Movements: Extraocular movements intact.     Conjunctiva/sclera: Conjunctivae normal.  Cardiovascular:     Rate and Rhythm: Normal rate and regular rhythm.     Heart sounds: Normal heart sounds.  Pulmonary:     Effort: Pulmonary effort is normal.     Breath sounds: Normal breath sounds.  Musculoskeletal:        General: Tenderness and signs of injury present. No swelling or deformity. Normal range of motion.     Cervical back: Normal range of motion and neck supple.     Comments: Tender to palpation right deltoid extending to right trapezius with mild spasm.  Skin:    General: Skin is warm and dry.  Neurological:     Mental Status: She is alert and oriented to person, place, and time.     Comments: Right upper extremity neurovascularly intact  Psychiatric:        Mood and Affect: Mood normal.        Thought Content: Thought content normal.        Judgment: Judgment normal.      UC Treatments / Results  Labs (all labs ordered are listed, but only abnormal results are displayed) Labs Reviewed - No data to display  EKG   Radiology No results found.  Procedures Procedures (including critical care  time)  Medications Ordered in UC Medications - No data to display  Initial Impression / Assessment and Plan / UC Course  I have reviewed the triage vital signs and the nursing notes.  Pertinent labs & imaging results that  were available during my care of the patient were reviewed by me and considered in my medical decision making (see chart for details).     Good range of motion and stability of right shoulder joint, symptoms and exam consistent with muscle strain/contusion from fall.  Will treat with low-dose Flexeril, naproxen as needed, heat, stretches and massage.  Follow-up with primary care for recheck if not resolving.  Final Clinical Impressions(s) / UC Diagnoses   Final diagnoses:  Strain of right shoulder, initial encounter   Discharge Instructions   None    ED Prescriptions    Medication Sig Dispense Auth. Provider   cyclobenzaprine (FLEXERIL) 5 MG tablet Take 1 tablet (5 mg total) by mouth 3 (three) times daily as needed for muscle spasms. 30 tablet Volney American, [place]   naproxen (NAPROSYN) 500 MG tablet Take 1 tablet (500 mg total) by mouth 2 (two) times daily as needed. 30 tablet Volney American, [place]     PDMP not reviewed this encounter.   Volney American, [place] 07/22/20 [phone]

## 2020-07-28 DIAGNOSIS — Z7984 Long term (current) use of oral hypoglycemic drugs: Secondary | ICD-10-CM | POA: Diagnosis not present

## 2020-07-28 DIAGNOSIS — I10 Essential (primary) hypertension: Secondary | ICD-10-CM | POA: Diagnosis not present

## 2020-07-28 DIAGNOSIS — E1165 Type 2 diabetes mellitus with hyperglycemia: Secondary | ICD-10-CM | POA: Diagnosis not present

## 2020-09-27 DIAGNOSIS — I10 Essential (primary) hypertension: Secondary | ICD-10-CM | POA: Diagnosis not present

## 2020-09-27 DIAGNOSIS — Z7984 Long term (current) use of oral hypoglycemic drugs: Secondary | ICD-10-CM | POA: Diagnosis not present

## 2020-09-27 DIAGNOSIS — E785 Hyperlipidemia, unspecified: Secondary | ICD-10-CM | POA: Diagnosis not present

## 2020-09-27 DIAGNOSIS — E1165 Type 2 diabetes mellitus with hyperglycemia: Secondary | ICD-10-CM | POA: Diagnosis not present

## 2020-11-15 DIAGNOSIS — I10 Essential (primary) hypertension: Secondary | ICD-10-CM | POA: Diagnosis not present

## 2020-11-15 DIAGNOSIS — E785 Hyperlipidemia, unspecified: Secondary | ICD-10-CM | POA: Diagnosis not present

## 2020-11-15 DIAGNOSIS — E1169 Type 2 diabetes mellitus with other specified complication: Secondary | ICD-10-CM | POA: Diagnosis not present

## 2020-11-15 DIAGNOSIS — E782 Mixed hyperlipidemia: Secondary | ICD-10-CM | POA: Diagnosis not present

## 2020-11-15 DIAGNOSIS — E1122 Type 2 diabetes mellitus with diabetic chronic kidney disease: Secondary | ICD-10-CM | POA: Diagnosis not present

## 2020-11-16 ENCOUNTER — Other Ambulatory Visit: Payer: Self-pay | Admitting: Family Medicine

## 2020-11-16 DIAGNOSIS — Z1231 Encounter for screening mammogram for malignant neoplasm of breast: Secondary | ICD-10-CM

## 2020-12-05 ENCOUNTER — Ambulatory Visit
Admission: RE | Admit: 2020-12-05 | Discharge: 2020-12-05 | Disposition: A | Discharge status: Home / Self Care | Payer: Medicare Other | Source: Ambulatory Visit | Attending: Family Medicine | Admitting: Family Medicine

## 2020-12-05 ENCOUNTER — Other Ambulatory Visit: Payer: Self-pay

## 2020-12-05 DIAGNOSIS — Z1231 Encounter for screening mammogram for malignant neoplasm of breast: Secondary | ICD-10-CM

## 2020-12-20 DIAGNOSIS — I10 Essential (primary) hypertension: Secondary | ICD-10-CM | POA: Diagnosis not present

## 2020-12-20 DIAGNOSIS — Z0000 Encounter for general adult medical examination without abnormal findings: Secondary | ICD-10-CM | POA: Diagnosis not present

## 2020-12-28 DIAGNOSIS — E1165 Type 2 diabetes mellitus with hyperglycemia: Secondary | ICD-10-CM | POA: Diagnosis not present

## 2020-12-28 DIAGNOSIS — Z7984 Long term (current) use of oral hypoglycemic drugs: Secondary | ICD-10-CM | POA: Diagnosis not present

## 2020-12-28 DIAGNOSIS — I10 Essential (primary) hypertension: Secondary | ICD-10-CM | POA: Diagnosis not present

## 2020-12-28 DIAGNOSIS — E785 Hyperlipidemia, unspecified: Secondary | ICD-10-CM | POA: Diagnosis not present

## 2021-02-27 DIAGNOSIS — E1165 Type 2 diabetes mellitus with hyperglycemia: Secondary | ICD-10-CM | POA: Diagnosis not present

## 2021-02-27 DIAGNOSIS — Z7984 Long term (current) use of oral hypoglycemic drugs: Secondary | ICD-10-CM | POA: Diagnosis not present

## 2021-02-27 DIAGNOSIS — I10 Essential (primary) hypertension: Secondary | ICD-10-CM | POA: Diagnosis not present

## 2021-03-01 ENCOUNTER — Emergency Department (HOSPITAL_BASED_OUTPATIENT_CLINIC_OR_DEPARTMENT_OTHER)
Admission: EM | Admit: 2021-03-01 | Discharge: 2021-03-01 | Disposition: A | Discharge status: Home / Self Care | Payer: Medicare Other | Attending: Emergency Medicine | Admitting: Emergency Medicine

## 2021-03-01 ENCOUNTER — Encounter (HOSPITAL_BASED_OUTPATIENT_CLINIC_OR_DEPARTMENT_OTHER): Payer: Self-pay | Admitting: Obstetrics and Gynecology

## 2021-03-01 ENCOUNTER — Emergency Department (HOSPITAL_BASED_OUTPATIENT_CLINIC_OR_DEPARTMENT_OTHER): Payer: Medicare Other | Admitting: Radiology

## 2021-03-01 ENCOUNTER — Other Ambulatory Visit: Payer: Self-pay

## 2021-03-01 DIAGNOSIS — M5136 Other intervertebral disc degeneration, lumbar region: Secondary | ICD-10-CM | POA: Diagnosis not present

## 2021-03-01 DIAGNOSIS — E119 Type 2 diabetes mellitus without complications: Secondary | ICD-10-CM | POA: Insufficient documentation

## 2021-03-01 DIAGNOSIS — Z7984 Long term (current) use of oral hypoglycemic drugs: Secondary | ICD-10-CM | POA: Diagnosis not present

## 2021-03-01 DIAGNOSIS — M549 Dorsalgia, unspecified: Secondary | ICD-10-CM | POA: Diagnosis not present

## 2021-03-01 DIAGNOSIS — Z743 Need for continuous supervision: Secondary | ICD-10-CM | POA: Diagnosis not present

## 2021-03-01 DIAGNOSIS — M5442 Lumbago with sciatica, left side: Secondary | ICD-10-CM | POA: Diagnosis not present

## 2021-03-01 DIAGNOSIS — I10 Essential (primary) hypertension: Secondary | ICD-10-CM | POA: Insufficient documentation

## 2021-03-01 DIAGNOSIS — Z79899 Other long term (current) drug therapy: Secondary | ICD-10-CM | POA: Diagnosis not present

## 2021-03-01 DIAGNOSIS — F17210 Nicotine dependence, cigarettes, uncomplicated: Secondary | ICD-10-CM | POA: Insufficient documentation

## 2021-03-01 DIAGNOSIS — M545 Low back pain, unspecified: Secondary | ICD-10-CM | POA: Diagnosis present

## 2021-03-01 DIAGNOSIS — R6889 Other general symptoms and signs: Secondary | ICD-10-CM | POA: Diagnosis not present

## 2021-03-01 MED ORDER — ACETAMINOPHEN 500 MG PO TABS
1000.0000 mg | ORAL_TABLET | Freq: Once | ORAL | Status: AC
  Administered 2021-03-01: 1000 mg via ORAL
  Filled 2021-03-01: qty 2

## 2021-03-01 MED ORDER — LIDOCAINE 5 % EX PTCH
1.0000 | MEDICATED_PATCH | CUTANEOUS | Status: DC
  Administered 2021-03-01: 1 via TRANSDERMAL
  Filled 2021-03-01: qty 1

## 2021-03-01 MED ORDER — LIDOCAINE 5 % EX PTCH
1.0000 | MEDICATED_PATCH | CUTANEOUS | 0 refills | Status: AC
Start: 2021-03-01 — End: 2021-03-06

## 2021-03-01 MED ORDER — CYCLOBENZAPRINE HCL 10 MG PO TABS
10.0000 mg | ORAL_TABLET | Freq: Once | ORAL | Status: AC
  Administered 2021-03-01: 10 mg via ORAL
  Filled 2021-03-01: qty 1

## 2021-03-01 MED ORDER — METHOCARBAMOL 500 MG PO TABS: 500.0000 mg | ORAL_TABLET | Freq: Two times a day (BID) | ORAL | 0 refills | Status: AC

## 2021-03-01 NOTE — ED Triage Notes (Signed)
Patient presents to the ER via EMS coming from home with midline lower back pain. Patient reports she started having this pain Monday morning. Patient denies injury or picking up anything heavy. Patient reports she cannot stand move or walk.  HR 96 RR 16 SPO2 98% on  RA 172/96 Patient reports the pain radiates to her left hip and that she has been drinking a lot of soda.

## 2021-03-01 NOTE — ED Provider Notes (Signed)
[facility] [facility] EMERGENCY DEPT Provider Note   CSN: [id number] Arrival date & time: 03/01/21  1228     History Chief Complaint  Patient presents with   Back Pain    Colleen [name] is a 65 y.o. female.  65 y.o female with a PMH of DM, HTN presents to the ED with a chief complaint of low back pain x 2 days. Patient reports pain along the midline of her back, she describes it as sharp originating at the lower back and radiating down her left hip and left knee.  Given Tylenol along with icy hot without any improvement in symptoms.  Had a similar episode in the past like this, however is unsure what works for her.  No bowel or bladder complaints, no IV drug use, no falls or prior hx of cancer.       The history is provided by the patient and medical records.  Back Pain Location:  Lumbar spine Quality:  Shooting and stabbing Radiates to:  L knee Pain severity:  Moderate Pain is:  Same all the time Onset quality:  Gradual Duration:  2 days Timing:  Intermittent Progression:  Worsening Chronicity:  Recurrent Context: twisting   Context: not falling, not physical stress and not recent illness   Relieved by:  Nothing Worsened by:  Movement Ineffective treatments:  Heating pad and ibuprofen Associated symptoms: leg pain   Associated symptoms: no abdominal pain, no chest pain, no fever, no paresthesias, no tingling, no weakness and no weight loss   Risk factors: no hx of cancer, no recent surgery and no steroid use       Past Medical History:  Diagnosis Date   Diabetes mellitus without complication ([place])    Hemorrhoids    Hypertension     There are no problems to display for this patient.   Past Surgical History:  Procedure Laterality Date   APPENDECTOMY       OB History   No obstetric history on file.     Family History  Problem Relation Age of Onset   Breast cancer Sister 42    Social History   Tobacco Use   Smoking status: Every Day     Packs/day: 0.50    Years: 25.00    Pack years: 12.50    Types: Cigarettes   Smokeless tobacco: Never  Vaping Use   Vaping Use: Never used  Substance Use Topics   Alcohol use: No   Drug use: Never    Home Medications Prior to Admission medications   Medication Sig Start Date End Date Taking? Authorizing Provider  cyclobenzaprine (FLEXERIL) 5 MG tablet Take 1 tablet (5 mg total) by mouth 3 (three) times daily as needed for muscle spasms. 07/22/20  Yes Volney American, PA-C  diclofenac Sodium (VOLTAREN) 1 % GEL apply 4 grams TOPICALLY FOUR TIMES DAILY 01/09/20  Yes McDonald, Adam R, DPM  fluticasone (FLONASE) 50 MCG/ACT nasal spray Place 2 sprays into both nostrils daily. 09/17/15  Yes Josiah Lobo Y, PA-C  hydrochlorothiazide (HYDRODIURIL) 12.5 MG tablet Take 12.5 mg by mouth daily. 10/09/19  Yes [provider]  lidocaine (LIDODERM) 5 % Place 1 patch onto the skin daily for 5 days. Remove & Discard patch within 12 hours or as directed by MD 03/01/21 03/06/21 Yes Janeece Fitting, PA-C  meclizine (ANTIVERT) 12.5 MG tablet Take 12.5 mg by mouth 3 (three) times daily. 10/21/19  Yes [provider]  metFORMIN (GLUCOPHAGE) 500 MG tablet Take 500 mg by mouth  at bedtime.   Yes [provider]  methocarbamol (ROBAXIN) 500 MG tablet Take 1 tablet (500 mg total) by mouth 2 (two) times daily for 7 days. 03/01/21 03/08/21 Yes Child Campoy, Beverley Fiedler, PA-C  methylPREDNISolone (MEDROL DOSEPAK) 4 MG TBPK tablet 6 day dose pack - take as directed 12/24/19  Yes McDonald, Adam R, DPM  naproxen (NAPROSYN) 500 MG tablet Take 1 tablet (500 mg total) by mouth 2 (two) times daily as needed. 07/22/20  Yes Volney American, PA-C  rosuvastatin (CRESTOR) 10 MG tablet Take 10 mg by mouth at bedtime. 10/09/19  Yes [provider]  telmisartan-hydrochlorothiazide (MICARDIS HCT) 40-12.5 MG tablet Take 1 tablet by mouth daily. 09/01/15  Yes [provider]  amoxicillin-clavulanate  (AUGMENTIN) 875-125 MG tablet Take 1 tablet by mouth every 12 (twelve) hours. Patient not taking: No sig reported 09/17/15   Tomi Likens, PA-C  diphenhydrAMINE (BENADRYL) 25 MG tablet Take 1 tablet (25 mg total) by mouth every 6 (six) hours. Patient not taking: Reported on 09/17/2015 09/01/14   Britt Bottom, NP  docusate sodium (COLACE) 100 MG capsule Take 1 capsule (100 mg total) by mouth every 12 (twelve) hours. Patient not taking: Reported on 09/17/2015 11/21/13   Evelina Bucy, MD  pseudoephedrine (SUDAFED) 60 MG tablet Take 1 tablet (60 mg total) by mouth every 4 (four) hours as needed for congestion. 09/17/15   Tomi Likens, PA-C    Allergies    Patient has no known allergies.  Review of Systems   Review of Systems  Constitutional:  Negative for fever and weight loss.  HENT:  Negative for sore throat.   Respiratory:  Negative for shortness of breath.   Cardiovascular:  Negative for chest pain.  Gastrointestinal:  Negative for abdominal pain, nausea and vomiting.  Genitourinary:  Negative for difficulty urinating.  Musculoskeletal:  Positive for back pain.  Neurological:  Negative for tingling, weakness and paresthesias.  All other systems reviewed and are negative.  Physical Exam Updated Vital Signs BP (!) 162/71 (BP Location: Right Arm)   Pulse 65   Temp 97.7 F (36.5 C) (Oral)   Resp 15   SpO2 100%   Physical Exam Vitals and nursing note reviewed.  Constitutional:      General: She is not in acute distress.    Appearance: Normal appearance. She is well-developed.  HENT:     Head: Normocephalic and atraumatic.     Mouth/Throat:     Mouth: Mucous membranes are moist.  Eyes:     Conjunctiva/sclera: Conjunctivae normal.     Pupils: Pupils are equal, round, and reactive to light.  Cardiovascular:     Rate and Rhythm: Normal rate and regular rhythm.     Heart sounds: No murmur heard. Pulmonary:     Effort: Pulmonary effort is normal. No respiratory  distress.     Breath sounds: Normal breath sounds.  Abdominal:     General: Abdomen is flat.     Palpations: Abdomen is soft.     Tenderness: There is no abdominal tenderness.  Musculoskeletal:     Cervical back: Neck supple.     Comments: RLE- KF,KE 5/5 strength LLE- HF, HE 5/5 strength Normal gait. No pronator drift. No leg drop.  CN I, II and VIII not tested. CN II-XII grossly intact bilaterally.      Skin:    General: Skin is warm and dry.  Neurological:     Mental Status: She is alert and oriented to person, place, and time.  ED Results / Procedures / Treatments   Labs (all labs ordered are listed, but only abnormal results are displayed) Labs Reviewed - No data to display  EKG None  Radiology DG Lumbar Spine Complete  Result Date: 03/01/2021 CLINICAL DATA:  Pain in lower back EXAM: LUMBAR SPINE - COMPLETE 4+ VIEW COMPARISON:  None FINDINGS: There is no evidence of lumbar spine fracture. Alignment is normal. Multi level ventral endplate spurring identified throughout the lumbar spine. Mild disc space narrowing identified most notable at L4-5. Aortic atherosclerosis. IMPRESSION: 1. No acute findings. 2. Mild degenerative disc disease. Electronically Signed   By: Kerby Moors M.D.   On: 03/01/2021 13:38    Procedures Procedures   Medications Ordered in ED Medications  lidocaine (LIDODERM) 5 % 1 patch (1 patch Transdermal Patch Applied 03/01/21 1337)  cyclobenzaprine (FLEXERIL) tablet 10 mg (10 mg Oral Given 03/01/21 1337)  acetaminophen (TYLENOL) tablet 1,000 mg (1,000 mg Oral Given 03/01/21 1336)    ED Course  I have reviewed the triage vital signs and the nursing notes.  Pertinent labs & imaging results that were available during my care of the patient were reviewed by me and considered in my medical decision making (see chart for details).    MDM Rules/Calculators/A&P   Patient presents to the ED via ambulance with a chief complaint of low back pain that  has been ongoing for the past 2-day, but similar symptoms in the past however is unsure what caused at this time.  No trauma, no falls.  Does report the pain is more so along the lumbar spine with radiation down her left leg into her knee.  Has tried Tylenol without improvement in symptoms.  During my evaluation exam is benign positive straight leg test noted.  No bruising or swelling noted to the posterior aspect, no midline tenderness.  Pulses are present and she moves bilateral lower extremities well.  We did discuss x-ray to rule out any acute finding along with symptomatic control.  Some suspicion for sciatica at this time. Xray of the lumbar spine showed: 1. No acute findings.  2. Mild degenerative disc disease.    Results were discussed with patient at length, she is aware that she will need to continue taking muscle relaxers in order to help with her symptoms, lidocaine patch along with ice and heat may help with symptomatic control as well.  She is agreeable with plan and management, patient discharged in stable condition.   Portions of this note were generated with [employer]. Dictation errors may occur despite best attempts at proofreading.  Final Clinical Impression(s) / ED Diagnoses Final diagnoses:  Acute left-sided low back pain with left-sided sciatica    Rx / DC Orders ED Discharge Orders          Ordered    methocarbamol (ROBAXIN) 500 MG tablet  2 times daily        03/01/21 1355    lidocaine (LIDODERM) 5 %  Every 24 hours        03/01/21 1355             Janeece Fitting, PA-C 03/01/21 1357    Truddie Hidden, MD 03/01/21 [phone]

## 2021-03-01 NOTE — Discharge Instructions (Addendum)
I have prescribed muscle relaxers for your pain, please do not drink or drive while taking this medications as it can make you drowsy.  In addition you were prescribed lidocaine patches, this should help with your pain, please apply to the area of pain.  You may also benefit from some NSAIDS, or tylenol to help with pain control.  Please follow-up with PCP in 1 week for reevaluation of your symptoms.If you experience any bowel or bladder incontinence, fever, worsening in your symptoms please return to the ED.

## 2021-04-28 DIAGNOSIS — I10 Essential (primary) hypertension: Secondary | ICD-10-CM | POA: Diagnosis not present

## 2021-04-28 DIAGNOSIS — Z7984 Long term (current) use of oral hypoglycemic drugs: Secondary | ICD-10-CM | POA: Diagnosis not present

## 2021-04-28 DIAGNOSIS — E1165 Type 2 diabetes mellitus with hyperglycemia: Secondary | ICD-10-CM | POA: Diagnosis not present

## 2021-04-28 DIAGNOSIS — E785 Hyperlipidemia, unspecified: Secondary | ICD-10-CM | POA: Diagnosis not present

## 2021-05-22 DIAGNOSIS — E785 Hyperlipidemia, unspecified: Secondary | ICD-10-CM | POA: Diagnosis not present

## 2021-05-22 DIAGNOSIS — E1165 Type 2 diabetes mellitus with hyperglycemia: Secondary | ICD-10-CM | POA: Diagnosis not present

## 2021-05-22 DIAGNOSIS — I10 Essential (primary) hypertension: Secondary | ICD-10-CM | POA: Diagnosis not present

## 2021-05-28 DIAGNOSIS — I10 Essential (primary) hypertension: Secondary | ICD-10-CM | POA: Diagnosis not present

## 2021-05-28 DIAGNOSIS — Z7984 Long term (current) use of oral hypoglycemic drugs: Secondary | ICD-10-CM | POA: Diagnosis not present

## 2021-05-28 DIAGNOSIS — E1165 Type 2 diabetes mellitus with hyperglycemia: Secondary | ICD-10-CM | POA: Diagnosis not present

## 2021-05-28 DIAGNOSIS — E785 Hyperlipidemia, unspecified: Secondary | ICD-10-CM | POA: Diagnosis not present

## 2021-06-27 DIAGNOSIS — Z7984 Long term (current) use of oral hypoglycemic drugs: Secondary | ICD-10-CM | POA: Diagnosis not present

## 2021-06-27 DIAGNOSIS — I10 Essential (primary) hypertension: Secondary | ICD-10-CM | POA: Diagnosis not present

## 2021-06-27 DIAGNOSIS — E785 Hyperlipidemia, unspecified: Secondary | ICD-10-CM | POA: Diagnosis not present

## 2021-06-27 DIAGNOSIS — E1165 Type 2 diabetes mellitus with hyperglycemia: Secondary | ICD-10-CM | POA: Diagnosis not present

## 2021-08-29 DIAGNOSIS — E1169 Type 2 diabetes mellitus with other specified complication: Secondary | ICD-10-CM | POA: Diagnosis not present

## 2021-08-29 DIAGNOSIS — Z0000 Encounter for general adult medical examination without abnormal findings: Secondary | ICD-10-CM | POA: Diagnosis not present

## 2021-08-29 DIAGNOSIS — E782 Mixed hyperlipidemia: Secondary | ICD-10-CM | POA: Diagnosis not present

## 2021-08-29 DIAGNOSIS — R21 Rash and other nonspecific skin eruption: Secondary | ICD-10-CM | POA: Diagnosis not present

## 2021-08-29 DIAGNOSIS — L853 Xerosis cutis: Secondary | ICD-10-CM | POA: Diagnosis not present

## 2021-09-27 DIAGNOSIS — E1165 Type 2 diabetes mellitus with hyperglycemia: Secondary | ICD-10-CM | POA: Diagnosis not present

## 2021-09-27 DIAGNOSIS — I10 Essential (primary) hypertension: Secondary | ICD-10-CM | POA: Diagnosis not present

## 2021-11-22 ENCOUNTER — Other Ambulatory Visit: Payer: Self-pay | Admitting: Family Medicine

## 2021-11-22 DIAGNOSIS — Z1231 Encounter for screening mammogram for malignant neoplasm of breast: Secondary | ICD-10-CM

## 2021-12-06 ENCOUNTER — Ambulatory Visit
Admission: RE | Admit: 2021-12-06 | Discharge: 2021-12-06 | Disposition: A | Discharge status: Home / Self Care | Payer: Medicare Other | Source: Ambulatory Visit | Attending: Family Medicine | Admitting: Family Medicine

## 2021-12-06 DIAGNOSIS — Z1231 Encounter for screening mammogram for malignant neoplasm of breast: Secondary | ICD-10-CM

## 2021-12-26 DIAGNOSIS — I10 Essential (primary) hypertension: Secondary | ICD-10-CM | POA: Diagnosis not present

## 2021-12-26 DIAGNOSIS — Z0001 Encounter for general adult medical examination with abnormal findings: Secondary | ICD-10-CM | POA: Diagnosis not present

## 2021-12-26 DIAGNOSIS — E1169 Type 2 diabetes mellitus with other specified complication: Secondary | ICD-10-CM | POA: Diagnosis not present

## 2021-12-26 DIAGNOSIS — E785 Hyperlipidemia, unspecified: Secondary | ICD-10-CM | POA: Diagnosis not present

## 2021-12-28 DIAGNOSIS — I10 Essential (primary) hypertension: Secondary | ICD-10-CM | POA: Diagnosis not present

## 2021-12-28 DIAGNOSIS — E1165 Type 2 diabetes mellitus with hyperglycemia: Secondary | ICD-10-CM | POA: Diagnosis not present

## 2021-12-28 DIAGNOSIS — Z7984 Long term (current) use of oral hypoglycemic drugs: Secondary | ICD-10-CM | POA: Diagnosis not present

## 2022-01-27 DIAGNOSIS — E1165 Type 2 diabetes mellitus with hyperglycemia: Secondary | ICD-10-CM | POA: Diagnosis not present

## 2022-01-27 DIAGNOSIS — I10 Essential (primary) hypertension: Secondary | ICD-10-CM | POA: Diagnosis not present

## 2022-06-28 DIAGNOSIS — E785 Hyperlipidemia, unspecified: Secondary | ICD-10-CM | POA: Diagnosis not present

## 2022-06-28 DIAGNOSIS — E1165 Type 2 diabetes mellitus with hyperglycemia: Secondary | ICD-10-CM | POA: Diagnosis not present

## 2022-06-28 DIAGNOSIS — I10 Essential (primary) hypertension: Secondary | ICD-10-CM | POA: Diagnosis not present

## 2022-07-06 DIAGNOSIS — Z0000 Encounter for general adult medical examination without abnormal findings: Secondary | ICD-10-CM | POA: Diagnosis not present

## 2022-07-06 DIAGNOSIS — E785 Hyperlipidemia, unspecified: Secondary | ICD-10-CM | POA: Diagnosis not present

## 2022-07-06 DIAGNOSIS — I10 Essential (primary) hypertension: Secondary | ICD-10-CM | POA: Diagnosis not present

## 2022-07-06 DIAGNOSIS — E1169 Type 2 diabetes mellitus with other specified complication: Secondary | ICD-10-CM | POA: Diagnosis not present

## 2022-09-28 DIAGNOSIS — I10 Essential (primary) hypertension: Secondary | ICD-10-CM | POA: Diagnosis not present

## 2022-09-28 DIAGNOSIS — E785 Hyperlipidemia, unspecified: Secondary | ICD-10-CM | POA: Diagnosis not present

## 2022-09-28 DIAGNOSIS — E1169 Type 2 diabetes mellitus with other specified complication: Secondary | ICD-10-CM | POA: Diagnosis not present

## 2022-10-08 DIAGNOSIS — L309 Dermatitis, unspecified: Secondary | ICD-10-CM | POA: Diagnosis not present

## 2022-10-08 DIAGNOSIS — E1169 Type 2 diabetes mellitus with other specified complication: Secondary | ICD-10-CM | POA: Diagnosis not present

## 2022-10-08 DIAGNOSIS — I10 Essential (primary) hypertension: Secondary | ICD-10-CM | POA: Diagnosis not present

## 2022-11-23 DIAGNOSIS — L309 Dermatitis, unspecified: Secondary | ICD-10-CM | POA: Diagnosis not present

## 2022-11-23 DIAGNOSIS — I10 Essential (primary) hypertension: Secondary | ICD-10-CM | POA: Diagnosis not present

## 2022-11-23 DIAGNOSIS — E1169 Type 2 diabetes mellitus with other specified complication: Secondary | ICD-10-CM | POA: Diagnosis not present

## 2022-12-10 ENCOUNTER — Other Ambulatory Visit: Payer: Self-pay | Admitting: Family Medicine

## 2022-12-10 DIAGNOSIS — Z1231 Encounter for screening mammogram for malignant neoplasm of breast: Secondary | ICD-10-CM

## 2022-12-24 ENCOUNTER — Ambulatory Visit
Admission: RE | Admit: 2022-12-24 | Discharge: 2022-12-24 | Disposition: A | Discharge status: Home / Self Care | Payer: 59 | Source: Ambulatory Visit | Attending: Family Medicine | Admitting: Family Medicine

## 2022-12-24 DIAGNOSIS — Z1231 Encounter for screening mammogram for malignant neoplasm of breast: Secondary | ICD-10-CM

## 2023-01-08 DIAGNOSIS — E1169 Type 2 diabetes mellitus with other specified complication: Secondary | ICD-10-CM | POA: Diagnosis not present

## 2023-01-08 DIAGNOSIS — I10 Essential (primary) hypertension: Secondary | ICD-10-CM | POA: Diagnosis not present

## 2023-05-10 DIAGNOSIS — I10 Essential (primary) hypertension: Secondary | ICD-10-CM | POA: Diagnosis not present

## 2023-05-10 DIAGNOSIS — E782 Mixed hyperlipidemia: Secondary | ICD-10-CM | POA: Diagnosis not present

## 2023-05-10 DIAGNOSIS — E1169 Type 2 diabetes mellitus with other specified complication: Secondary | ICD-10-CM | POA: Diagnosis not present

## 2023-11-08 DIAGNOSIS — I10 Essential (primary) hypertension: Secondary | ICD-10-CM | POA: Diagnosis not present

## 2023-11-08 DIAGNOSIS — E559 Vitamin D deficiency, unspecified: Secondary | ICD-10-CM | POA: Diagnosis not present

## 2023-11-08 DIAGNOSIS — E7800 Pure hypercholesterolemia, unspecified: Secondary | ICD-10-CM | POA: Diagnosis not present

## 2023-11-08 DIAGNOSIS — E1169 Type 2 diabetes mellitus with other specified complication: Secondary | ICD-10-CM | POA: Diagnosis not present

## 2023-11-18 ENCOUNTER — Other Ambulatory Visit: Payer: Self-pay | Admitting: Family Medicine

## 2023-11-18 DIAGNOSIS — Z0000 Encounter for general adult medical examination without abnormal findings: Secondary | ICD-10-CM

## 2023-12-25 ENCOUNTER — Ambulatory Visit
Admission: RE | Admit: 2023-12-25 | Discharge: 2023-12-25 | Disposition: A | Discharge status: Home / Self Care | Source: Ambulatory Visit | Attending: Family Medicine | Admitting: Family Medicine

## 2023-12-25 DIAGNOSIS — Z0000 Encounter for general adult medical examination without abnormal findings: Secondary | ICD-10-CM

## 2023-12-25 DIAGNOSIS — Z1231 Encounter for screening mammogram for malignant neoplasm of breast: Secondary | ICD-10-CM | POA: Diagnosis not present

## 2024-02-24 ENCOUNTER — Encounter: Payer: Self-pay | Admitting: *Deleted

## 2024-02-24 NOTE — Progress Notes (Signed)
 Colleen [name]                                          MRN: [id number]   02/24/2024   The [facility] Quality Team Specialist reviewed this patient medical record for the purposes of chart review for care gap closure. The following were reviewed: chart review for care gap closure-glycemic status assessment and kidney health evaluation for diabetes:eGFR  and uACR.    [facility] Quality Team

## 2024-03-03 ENCOUNTER — Other Ambulatory Visit (HOSPITAL_COMMUNITY)
Admission: RE | Admit: 2024-03-03 | Discharge: 2024-03-03 | Disposition: A | Discharge status: Home / Self Care | Source: Ambulatory Visit | Attending: Family Medicine | Admitting: Family Medicine

## 2024-03-03 ENCOUNTER — Other Ambulatory Visit (HOSPITAL_COMMUNITY): Admission: RE | Admit: 2024-03-03 | Source: Ambulatory Visit

## 2024-03-03 DIAGNOSIS — Z124 Encounter for screening for malignant neoplasm of cervix: Secondary | ICD-10-CM | POA: Diagnosis present

## 2024-03-03 DIAGNOSIS — R102 Pelvic and perineal pain unspecified side: Secondary | ICD-10-CM | POA: Diagnosis not present

## 2024-03-05 LAB — CYTOLOGY - PAP
Adequacy: ABSENT
Diagnosis: NEGATIVE

## 2024-03-05 LAB — MOLECULAR ANCILLARY ONLY
Bacterial Vaginitis (gardnerella): NEGATIVE
Candida Glabrata: NEGATIVE
Candida Vaginitis: POSITIVE — AB
Chlamydia: NEGATIVE
Comment: NEGATIVE
Comment: NEGATIVE
Comment: NEGATIVE
Comment: NEGATIVE
Comment: NEGATIVE
Comment: NORMAL
Neisseria Gonorrhea: NEGATIVE
Trichomonas: NEGATIVE
